# Patient Record
Sex: Male | Born: 1970
Health system: Southern US, Community
[De-identification: ages and names within clinical notes are randomized; demographics above are authoritative.]

## PROBLEM LIST (undated history)

## (undated) DIAGNOSIS — E669 Obesity, unspecified: Secondary | ICD-10-CM

## (undated) DIAGNOSIS — E785 Hyperlipidemia, unspecified: Secondary | ICD-10-CM

## (undated) DIAGNOSIS — I10 Essential (primary) hypertension: Secondary | ICD-10-CM

## (undated) DIAGNOSIS — E7800 Pure hypercholesterolemia, unspecified: Secondary | ICD-10-CM

## (undated) DIAGNOSIS — E119 Type 2 diabetes mellitus without complications: Secondary | ICD-10-CM

## (undated) DIAGNOSIS — R0681 Apnea, not elsewhere classified: Secondary | ICD-10-CM

## (undated) HISTORY — DX: Essential (primary) hypertension: I10

## (undated) HISTORY — DX: Obesity, unspecified: E66.9

## (undated) HISTORY — DX: Apnea, not elsewhere classified: R06.81

## (undated) HISTORY — DX: Hyperlipidemia, unspecified: E78.5

## (undated) HISTORY — DX: Type 2 diabetes mellitus without complications: E11.9

---

## 2001-01-13 ENCOUNTER — Inpatient Hospital Stay (HOSPITAL_COMMUNITY): Admission: RE | Admit: 2001-01-13 | Discharge: 2001-01-14 | Payer: Self-pay | Admitting: Orthopaedic Surgery

## 2013-10-01 ENCOUNTER — Encounter: Payer: Self-pay | Admitting: Family Medicine

## 2013-10-01 ENCOUNTER — Ambulatory Visit (INDEPENDENT_AMBULATORY_CARE_PROVIDER_SITE_OTHER): Payer: BC Managed Care – PPO | Admitting: Family Medicine

## 2013-10-01 VITALS — BP 132/94 | HR 84 | Temp 97.4°F | Resp 18 | Ht 74.0 in | Wt 339.0 lb

## 2013-10-01 DIAGNOSIS — E1165 Type 2 diabetes mellitus with hyperglycemia: Principal | ICD-10-CM

## 2013-10-01 DIAGNOSIS — R0681 Apnea, not elsewhere classified: Secondary | ICD-10-CM | POA: Insufficient documentation

## 2013-10-01 DIAGNOSIS — E785 Hyperlipidemia, unspecified: Secondary | ICD-10-CM

## 2013-10-01 DIAGNOSIS — IMO0001 Reserved for inherently not codable concepts without codable children: Secondary | ICD-10-CM

## 2013-10-01 DIAGNOSIS — E669 Obesity, unspecified: Secondary | ICD-10-CM | POA: Insufficient documentation

## 2013-10-01 DIAGNOSIS — I10 Essential (primary) hypertension: Secondary | ICD-10-CM

## 2013-10-01 LAB — LIPID PANEL
Cholesterol: 220 mg/dL — ABNORMAL HIGH (ref 0–200)
HDL: 36 mg/dL — ABNORMAL LOW (ref >39)
LDL Cholesterol: 152 mg/dL — ABNORMAL HIGH (ref 0–99)
Total CHOL/HDL Ratio: 6.1 Ratio
Triglycerides: 158 mg/dL — ABNORMAL HIGH (ref <150)
VLDL: 32 mg/dL (ref 0–40)

## 2013-10-01 LAB — COMPLETE METABOLIC PANEL WITH GFR
ALT: 29 U/L (ref 0–53)
AST: 21 U/L (ref 0–37)
Albumin: 4.4 g/dL (ref 3.5–5.2)
Alkaline Phosphatase: 65 U/L (ref 39–117)
BUN: 15 mg/dL (ref 6–23)
CO2: 26 mEq/L (ref 19–32)
Calcium: 9.1 mg/dL (ref 8.4–10.5)
Chloride: 101 mEq/L (ref 96–112)
Creat: 0.71 mg/dL (ref 0.50–1.35)
GFR, Est African American: >89 mL/min
GFR, Est Non African American: >89 mL/min
Glucose, Bld: 162 mg/dL — ABNORMAL HIGH (ref 70–99)
Potassium: 4.2 mEq/L (ref 3.5–5.3)
Sodium: 137 mEq/L (ref 135–145)
Total Bilirubin: 0.6 mg/dL (ref 0.2–1.2)
Total Protein: 7 g/dL (ref 6.0–8.3)

## 2013-10-01 LAB — HEMOGLOBIN A1C
Hgb A1c MFr Bld: 7.7 % — ABNORMAL HIGH (ref <5.7)
Mean Plasma Glucose: 174 mg/dL — ABNORMAL HIGH (ref <117)

## 2013-10-01 LAB — MICROALBUMIN, URINE: Microalb, Ur: 1.13 mg/dL (ref 0.00–1.89)

## 2013-10-01 MED ORDER — DAPAGLIFLOZIN PROPANEDIOL 10 MG PO TABS: 10.0000 mg | ORAL_TABLET | Freq: Every day | ORAL | Status: DC

## 2013-10-01 MED ORDER — METFORMIN HCL 1000 MG PO TABS: 1000.0000 mg | ORAL_TABLET | Freq: Every day | ORAL | Status: DC

## 2013-10-01 MED ORDER — LOSARTAN POTASSIUM 25 MG PO TABS: 25.0000 mg | ORAL_TABLET | Freq: Every day | ORAL | Status: DC

## 2013-10-01 NOTE — Progress Notes (Signed)
Subjective:    Patient ID: Kyle Wyatt, male    DOB: 1971/01/10, 43 y.o.   MRN: [id number]  HPI Patient is a very pleasant 43 year old white male who presents today for followup. He has not been seen in more than one year. Is history of diabetes mellitus type 2. He was taking metformin 1000 mg by mouth twice a day and invokana 150 mg poqday.  He has been off his medications for several weeks due to lack of insurance and cost. He is not checking his sugars. His blood pressures also slightly elevated today although he has not been taking his losartan. He also has a history of hyperlipidemia but he has not been taking the Pravachol 40 mg by mouth daily due to lack of insurance. He denies any myalgias or right upper quadrant pain on the medication. He denies any chest pain shortness of breath or dyspnea on exertion. He is not engaging in any regular exercise. He is not taking an aspirin. He has never seen an eye doctor. Past Medical History  Diagnosis Date  . Hyperlipidemia   . Hypertension   . Diabetes mellitus without complication   . Obesity   . Apnea    No current outpatient prescriptions on file prior to visit.   No current facility-administered medications on file prior to visit.   Not on File History   Social History  . Marital Status: Married    Spouse Name: N/A    Number of Children: N/A  . Years of Education: N/A   Occupational History  . Not on file.   Social History Main Topics  . Smoking status: Never Smoker   . Smokeless tobacco: Not on file  . Alcohol Use: No  . Drug Use: No  . Sexual Activity: Not on file   Other Topics Concern  . Not on file   Social History Narrative  . No narrative on file   Family History  Problem Relation Age of Onset  . Heart disease Father       Review of Systems  All other systems reviewed and are negative.       Objective:   Physical Exam  Vitals reviewed. Constitutional: He appears well-developed and  well-nourished.  Neck: Neck supple. No JVD present.  Cardiovascular: Normal rate, regular rhythm and normal heart sounds.  Exam reveals no gallop.   No murmur heard. Pulmonary/Chest: Effort normal and breath sounds normal. No respiratory distress. He has no wheezes. He has no rales. He exhibits no tenderness.  Abdominal: Soft. Bowel sounds are normal. He exhibits no distension. There is no tenderness. There is no rebound and no guarding.  Musculoskeletal: He exhibits no edema.  Lymphadenopathy:    He has no cervical adenopathy.   Diabetic foot exam was performed. He has normal pulses in both feet. He has normal sensation 10 mg thumb. There is no ulcers or deformities in the feet.       Assessment & Plan:  1. Type II or unspecified type diabetes mellitus without mention of complication, uncontrolled Recommend patient begin taking aspirin 81 mg by mouth daily. Recommended an annual exam. Check a CBC CMP fasting lipid panel and urine microalbumin. Restart patient on metformin 1000 mg by mouth twice a day. I will also start the patient on farxiga 10 mg poqday.  Recheck hemoglobin A1c in 3 months. - COMPLETE METABOLIC PANEL WITH GFR - Lipid panel - Hemoglobin A1c - Microalbumin, urine  2. HTN (hypertension) Resume losartan 25 mg poqday.  3. HLD (hyperlipidemia) Check fasting lipid panel. LDL is less than 100

## 2013-10-07 ENCOUNTER — Telehealth: Payer: Self-pay | Admitting: *Deleted

## 2013-10-07 MED ORDER — PRAVASTATIN SODIUM 40 MG PO TABS: 40.0000 mg | ORAL_TABLET | Freq: Every day | ORAL | Status: DC

## 2013-10-07 NOTE — Telephone Encounter (Signed)
Per orders on labs, medication sent to pharmacy.   Call placed to patient and patient wife made aware.

## 2013-10-27 ENCOUNTER — Encounter: Payer: Self-pay | Admitting: Family Medicine

## 2013-10-28 ENCOUNTER — Other Ambulatory Visit: Payer: Self-pay | Admitting: Family Medicine

## 2013-11-01 MED ORDER — GLUCOSE BLOOD VI STRP: ORAL_STRIP | Status: DC

## 2013-11-01 MED ORDER — CANAGLIFLOZIN 300 MG PO TABS: 1.0000 | ORAL_TABLET | Freq: Every day | ORAL | Status: DC

## 2014-02-22 ENCOUNTER — Other Ambulatory Visit: Payer: Self-pay

## 2014-02-22 ENCOUNTER — Telehealth: Payer: Self-pay | Admitting: *Deleted

## 2014-02-22 ENCOUNTER — Encounter: Payer: Self-pay | Admitting: Family Medicine

## 2014-02-22 ENCOUNTER — Emergency Department (HOSPITAL_COMMUNITY): Payer: BC Managed Care – PPO

## 2014-02-22 ENCOUNTER — Emergency Department (HOSPITAL_COMMUNITY)
Admission: EM | Admit: 2014-02-22 | Discharge: 2014-02-22 | Disposition: A | Discharge status: Home / Self Care | Payer: BC Managed Care – PPO | Attending: Emergency Medicine | Admitting: Emergency Medicine

## 2014-02-22 ENCOUNTER — Ambulatory Visit (INDEPENDENT_AMBULATORY_CARE_PROVIDER_SITE_OTHER): Payer: BC Managed Care – PPO | Admitting: Family Medicine

## 2014-02-22 ENCOUNTER — Encounter (HOSPITAL_COMMUNITY): Payer: Self-pay | Admitting: Emergency Medicine

## 2014-02-22 VITALS — BP 132/86 | HR 68 | Temp 98.3°F | Resp 14 | Ht 75.0 in | Wt 329.0 lb

## 2014-02-22 DIAGNOSIS — E119 Type 2 diabetes mellitus without complications: Secondary | ICD-10-CM | POA: Insufficient documentation

## 2014-02-22 DIAGNOSIS — Z79899 Other long term (current) drug therapy: Secondary | ICD-10-CM | POA: Insufficient documentation

## 2014-02-22 DIAGNOSIS — R079 Chest pain, unspecified: Secondary | ICD-10-CM | POA: Insufficient documentation

## 2014-02-22 DIAGNOSIS — E669 Obesity, unspecified: Secondary | ICD-10-CM | POA: Insufficient documentation

## 2014-02-22 DIAGNOSIS — E785 Hyperlipidemia, unspecified: Secondary | ICD-10-CM

## 2014-02-22 DIAGNOSIS — J209 Acute bronchitis, unspecified: Secondary | ICD-10-CM

## 2014-02-22 DIAGNOSIS — Z7982 Long term (current) use of aspirin: Secondary | ICD-10-CM | POA: Insufficient documentation

## 2014-02-22 DIAGNOSIS — I10 Essential (primary) hypertension: Secondary | ICD-10-CM

## 2014-02-22 DIAGNOSIS — R51 Headache: Secondary | ICD-10-CM | POA: Insufficient documentation

## 2014-02-22 HISTORY — DX: Pure hypercholesterolemia, unspecified: E78.00

## 2014-02-22 LAB — BASIC METABOLIC PANEL
Anion gap: 16 — ABNORMAL HIGH (ref 5–15)
BUN: 13 mg/dL (ref 6–23)
CALCIUM: 9.3 mg/dL (ref 8.4–10.5)
CO2: 23 mEq/L (ref 19–32)
CREATININE: 0.65 mg/dL (ref 0.50–1.35)
Chloride: 101 mEq/L (ref 96–112)
GFR calc non Af Amer: >90 mL/min (ref >90)
Glucose, Bld: 134 mg/dL — ABNORMAL HIGH (ref 70–99)
Potassium: 4.3 mEq/L (ref 3.7–5.3)
Sodium: 140 mEq/L (ref 137–147)

## 2014-02-22 LAB — CBC
HCT: 47.2 % (ref 39.0–52.0)
Hemoglobin: 15.7 g/dL (ref 13.0–17.0)
MCH: 28.4 pg (ref 26.0–34.0)
MCHC: 33.3 g/dL (ref 30.0–36.0)
MCV: 85.5 fL (ref 78.0–100.0)
Platelets: 256 10*3/uL (ref 150–400)
RBC: 5.52 MIL/uL (ref 4.22–5.81)
RDW: 12.9 % (ref 11.5–15.5)
WBC: 7.8 10*3/uL (ref 4.0–10.5)

## 2014-02-22 LAB — I-STAT TROPONIN, ED: Troponin i, poc: 0 ng/mL (ref 0.00–0.08)

## 2014-02-22 LAB — PRO B NATRIURETIC PEPTIDE: Pro B Natriuretic peptide (BNP): 33.6 pg/mL (ref 0–125)

## 2014-02-22 MED ORDER — ALBUTEROL SULFATE HFA 108 (90 BASE) MCG/ACT IN AERS
2.0000 | INHALATION_SPRAY | RESPIRATORY_TRACT | Status: DC
  Administered 2014-02-22: 2 via RESPIRATORY_TRACT
  Filled 2014-02-22: qty 6.7

## 2014-02-22 MED ORDER — IPRATROPIUM-ALBUTEROL 0.5-2.5 (3) MG/3ML IN SOLN
3.0000 mL | Freq: Once | RESPIRATORY_TRACT | Status: AC
  Administered 2014-02-22: 3 mL via RESPIRATORY_TRACT
  Filled 2014-02-22: qty 3

## 2014-02-22 MED ORDER — ALBUTEROL SULFATE (2.5 MG/3ML) 0.083% IN NEBU
2.5000 mg | INHALATION_SOLUTION | Freq: Once | RESPIRATORY_TRACT | Status: AC
  Administered 2014-02-22: 2.5 mg via RESPIRATORY_TRACT
  Filled 2014-02-22: qty 3

## 2014-02-22 NOTE — ED Provider Notes (Signed)
CSN: [id number]     Arrival date & time 02/22/14  1318 History   First MD Initiated Contact with Patient 02/22/14 1635     Chief Complaint  Patient presents with  . Chest Pain     (Consider location/radiation/quality/duration/timing/severity/associated sxs/prior Treatment) The history is provided by the patient and the spouse.   Kyle Wyatt is a 43 y.o. male presenting with a 3 day history of persistent subtle chest pressure which becomes more noticeable with exertion along with a nonproductive cough which is also triggered by exertion.  He denies chest pain.  He reports having cold like symptoms last week including some nasal and sinus congestion with sore throat which has improved.  He has a history of pneumonia and was concerned about this possibility so was seen by his pcp today and sent here for further evaluation. He was told he had an abnormal ekg in their office today but does have a copy of this ekg with him.  He denies fevers, chills, nausea, vomiting. He is not a smoker and has no recent periods of sedation/long car/plane trips,denies lower extremity trauma or swelling.  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     Past Medical History  Diagnosis Date  . Obesity   . Diabetes mellitus without complication   . Hypertension   . High cholesterol    History reviewed. No pertinent past surgical history. History reviewed. No pertinent family history. History  Substance Use Topics  . Smoking status: Not on file  . Smokeless tobacco: Not on file  . Alcohol Use: No    Review of Systems  Constitutional: Negative for fever and chills.  HENT: Negative for congestion and sore throat.   Eyes: Negative.   Respiratory: Positive for chest tightness and shortness of breath. Negative for wheezing and stridor.   Cardiovascular: Negative for chest pain and palpitations.  Gastrointestinal: Negative for nausea and  abdominal pain.  Genitourinary: Negative.   Musculoskeletal: Negative for arthralgias, joint swelling and neck pain.  Skin: Negative.  Negative for rash and wound.  Neurological: Positive for headaches. Negative for dizziness, weakness, light-headedness and numbness.  Psychiatric/Behavioral: Negative.       Allergies  Review of patient's allergies indicates no known allergies.  Home Medications   Prior to Admission medications   Medication Sig Start Date End Date Taking? Authorizing Provider  acetaminophen (TYLENOL) 500 MG tablet Take 1,000 mg by mouth every 6 (six) hours as needed for headache.   Yes Historical Provider, MD  aspirin 81 MG tablet Take 81 mg by mouth every morning.   Yes Historical Provider, MD  Canagliflozin (INVOKANA) 300 MG TABS Take 300 mg by mouth every morning.   Yes Historical Provider, MD  DiphenhydrAMINE HCl (ALLERGY MEDICATION PO) Take 1 tablet by mouth daily as needed (for congestion).   Yes Historical Provider, MD  guaiFENesin (MUCINEX) 600 MG 12 hr tablet Take 600 mg by mouth 2 (two) times daily as needed for cough or to loosen phlegm.   Yes Historical Provider, MD  losartan (COZAAR) 25 MG tablet Take 25 mg by mouth every morning.    Yes Historical Provider, MD  metFORMIN (GLUMETZA) 1000 MG (MOD) 24 hr tablet Take 1,000 mg by mouth daily with breakfast.   Yes Historical Provider, MD   BP 109/52  Pulse 106  Temp(Src) 98.1 F (36.7 C) (Oral)  Resp 23  SpO2 98% Physical Exam  Nursing note  and vitals reviewed. Constitutional: He appears well-developed and well-nourished.  HENT:  Head: Normocephalic and atraumatic.  Mouth/Throat: No oropharyngeal exudate.  Eyes: Conjunctivae are normal.  Neck: Normal range of motion.  Cardiovascular: Normal rate, regular rhythm, normal heart sounds and intact distal pulses.   Pulmonary/Chest: Effort normal and breath sounds normal. He has no wheezes. He has no rhonchi. He has no rales. He exhibits no tenderness.   Decreased breath sounds throughout.  No wheezing.  Abdominal: Soft. Bowel sounds are normal. There is no tenderness.  Musculoskeletal: Normal range of motion.  Neurological: He is alert.  Skin: Skin is warm and dry.  Psychiatric: He has a normal mood and affect.    ED Course  Procedures (including critical care time) Labs Review Labs Reviewed  BASIC METABOLIC PANEL - Abnormal; Notable for the following:    Glucose, Bld 134 (*)    Anion gap 16 (*)    All other components within normal limits  CBC  PRO B NATRIURETIC PEPTIDE  I-STAT TROPOININ, ED    Imaging Review Dg Chest 2 View  02/22/2014   CLINICAL DATA:  CHEST PAINChest pain, shortness of breath  EXAM: CHEST  2 VIEW  COMPARISON:  None.  FINDINGS: The cardiac and mediastinal silhouettes are stable in size and contour, and remain within normal limits.  The lungs are normally inflated. No airspace consolidation, pleural effusion, or pulmonary edema is identified. There is no pneumothorax.  No acute osseous abnormality identified.  IMPRESSION: No active cardiopulmonary disease.   Electronically Signed   By: Rise Mu M.D.   On: 02/22/2014 15:05     EKG Interpretation   Date/Time:  Tuesday February 22 2014 13:24:28 EDT Ventricular Rate:  85 PR Interval:  202 QRS Duration: 94 QT Interval:  380 QTC Calculation: 452 R Axis:   12 Text Interpretation:  Normal sinus rhythm Cannot rule out Anterior infarct  , age undetermined Abnormal ECG No previous tracing Confirmed by POLLINA   MD, CHRISTOPHER [phone]) on 02/22/2014 6:45:47 PM      MDM   Final diagnoses:  Bronchitis, acute, with bronchospasm    Pt was given albuterol and atrovent neb tx with resolution of chest tightness.  Pt feels much improved.  Re-exam with improved aeration, new expiratory wheeze at bases. He was ambulated in dept with no increased sob, no desaturation.   Discussed home tx including albuterol mdi, +/- steroids. Pt defers steroids given glucose  control issues.  I do not feel he needs steroids given significant response to neb alone.  He was given an mdi with spacer.  Advised f/u with pcp prn.  Consider wearing mask when mowing to avoid inhaling dust.    Burgess Amor, PA-C 02/23/14 1119

## 2014-02-22 NOTE — ED Notes (Signed)
Pt reports having chest tightness x 2 days with diaphoresis and difficulty catching his breath. Also reports recent cold symptoms and sore throat. Went to pcp office and sent here for further eval. ekg done at triage and airway intact.

## 2014-02-22 NOTE — Patient Instructions (Signed)
[facility]

## 2014-02-22 NOTE — ED Notes (Signed)
Patient is currently taking a breathing treatment.

## 2014-02-22 NOTE — Progress Notes (Signed)
Patient ID: Kyle FannyRichard Kyle Wyatt, male   DOB: [birth year], 43 y.o.   MRN: [id number]   Subjective:    Patient ID: Kyle Wyatt, male    DOB: [birth year], 43 y.o.   MRN: [id number]  Patient presents for Chest Discomfort/ Dizziness   patient here with chest pain and tightness for the past 4 days. He thought he may have had walking pneumonia but he's not had any cough. The pain is on and off throughout the day sometimes worse with exertion but nonradiating. He does have some diaphoresis and feels like he can't catch his breath. He's not had any fever vomiting or GI symptoms. He is history of uncontrolled diabetes mellitus as well as hypertension hyperlipidemia.    Review Of Systems:  GEN- denies fatigue, fever, weight loss,weakness, recent illness HEENT- denies eye drainage, change in vision, nasal discharge, CVS- + chest pain, palpitations RESP- + SOB, cough, wheeze ABD- denies N/V, change in stools, abd pain GU- denies dysuria, hematuria, dribbling, incontinence MSK- denies joint pain, muscle aches, injury Neuro- denies headache, +dizziness, syncope, seizure activity       Objective:    BP 132/86  Pulse 68  Temp(Src) 98.3 F (36.8 C) (Oral)  Resp 14  Ht 6\' 3"  (1.905 m)  Wt 329 lb (149.233 kg)  BMI 41.12 kg/m2  SpO2 97% GEN- NAD, alert and oriented x3, obese HEENT- PERRL, EOMI, non injected sclera, pink conjunctiva, MMM, oropharynx clear Neck- Supple, no LAD CVS- RRR, no murmur RESP-CTAB EXT- No edema Pulses- Radial 2+  EKG- NSR, decreased QRS contour, no ST elevation, 1st degree AV block, no comparison        Assessment & Plan:      Problem List Items Addressed This Visit   None      Note: This dictation was prepared with Dragon dictation along with smaller phrase technology. Any transcriptional errors that result from this process are unintentional.

## 2014-02-22 NOTE — Assessment & Plan Note (Signed)
Blood pressure well controlled

## 2014-02-22 NOTE — ED Notes (Signed)
Pt ambulated in hallway with standby assist. 97% on RA. Pt denies SOB, dizziness or other complaint.

## 2014-02-22 NOTE — Discharge Instructions (Signed)
Acute Bronchitis Bronchitis is inflammation of the airways that extend from the windpipe into the lungs (bronchi). The inflammation often causes mucus to develop. This leads to a cough, which is the most common symptom of bronchitis.  In acute bronchitis, the condition usually develops suddenly and goes away over time, usually in a couple weeks. Smoking, allergies, and asthma can make bronchitis worse. Repeated episodes of bronchitis may cause further lung problems.  CAUSES Acute bronchitis is most often caused by the same virus that causes a cold. The virus can spread from person to person (contagious) through coughing, sneezing, and touching contaminated objects. SIGNS AND SYMPTOMS   Cough.   Fever.   Coughing up mucus.   Body aches.   Chest congestion.   Chills.   Shortness of breath.   Sore throat.  DIAGNOSIS  Acute bronchitis is usually diagnosed through a physical exam. Your health care provider will also ask you questions about your medical history. Tests, such as chest X-rays, are sometimes done to rule out other conditions.  TREATMENT  Acute bronchitis usually goes away in a couple weeks. Oftentimes, no medical treatment is necessary. Medicines are sometimes given for relief of fever or cough. Antibiotic medicines are usually not needed but may be prescribed in certain situations. In some cases, an inhaler may be recommended to help reduce shortness of breath and control the cough. A cool mist vaporizer may also be used to help thin bronchial secretions and make it easier to clear the chest.  HOME CARE INSTRUCTIONS  Get plenty of rest.   Drink enough fluids to keep your urine clear or pale yellow (unless you have a medical condition that requires fluid restriction). Increasing fluids may help thin your respiratory secretions (sputum) and reduce chest congestion, and it will prevent dehydration.   Take medicines only as directed by your health care provider.  If  you were prescribed an antibiotic medicine, finish it all even if you start to feel better.  Avoid smoking and secondhand smoke. Exposure to cigarette smoke or irritating chemicals will make bronchitis worse. If you are a smoker, consider using nicotine gum or skin patches to help control withdrawal symptoms. Quitting smoking will help your lungs heal faster.   Reduce the chances of another bout of acute bronchitis by washing your hands frequently, avoiding people with cold symptoms, and trying not to touch your hands to your mouth, nose, or eyes.   Keep all follow-up visits as directed by your health care provider.  SEEK MEDICAL CARE IF: Your symptoms do not improve after 1 week of treatment.  SEEK IMMEDIATE MEDICAL CARE IF:  You develop an increased fever or chills.   You have chest pain.   You have severe shortness of breath.  You have bloody sputum.   You develop dehydration.  You faint or repeatedly feel like you are going to pass out.  You develop repeated vomiting.  You develop a severe headache. MAKE SURE YOU:   Understand these instructions.  Will watch your condition.  Will get help right away if you are not doing well or get worse. Document Released: 08/22/2004 Document Revised: 11/29/2013 Document Reviewed: 01/05/2013 [facility] Patient Information 2015 [place], [place]. This information is not intended to replace advice given to you by your health care provider. Make sure you discuss any questions you have with your health care provider.   Use your inhaler if you are wheezing or short of breath - 2 puffs every 4 hours.  Get rechecked if you  develop any worsened symptoms.  Consider wearing protection (mask) when out mowing.

## 2014-02-22 NOTE — Assessment & Plan Note (Signed)
Last A1c 7.7% continue current medications.

## 2014-02-22 NOTE — ED Notes (Signed)
ED PA at bedside

## 2014-02-22 NOTE — ED Notes (Signed)
Pt states that when he has the balling up pain in his chest, he became sweaty, clammy, and short of breath

## 2014-02-22 NOTE — Assessment & Plan Note (Signed)
With his comorbidities of hypertension diabetes in no true sign of any lung pathology or infection today in the office I will send him to the ER for chest x-ray as well as a set of troponins. He voiced understanding and will go directly his wife is with him today.

## 2014-02-22 NOTE — Telephone Encounter (Signed)
Noted that patient has not gone to ER as directed by MD.   Call placed to patient to inquire. No answer, no VM noted.

## 2014-02-22 NOTE — ED Notes (Signed)
Family at bedside. 

## 2014-02-23 NOTE — ED Provider Notes (Signed)
Medical screening examination/treatment/procedure(s) were performed by non-physician practitioner and as supervising physician I was immediately available for consultation/collaboration.   EKG Interpretation   Date/Time:  Tuesday February 22 2014 13:24:28 EDT Ventricular Rate:  85 PR Interval:  202 QRS Duration: 94 QT Interval:  380 QTC Calculation: 452 R Axis:   12 Text Interpretation:  Normal sinus rhythm Cannot rule out Anterior infarct  , age undetermined Abnormal ECG No previous tracing Confirmed by Blinda LeatherwoodPOLLINA   MD, Margarita Croke [phone]([id number]) on 02/22/2014 6:45:47 PM        Kyle Creasehristopher J. Vollie Aaron, MD 02/23/14 1750

## 2014-03-05 ENCOUNTER — Other Ambulatory Visit: Payer: Self-pay | Admitting: Family Medicine

## 2014-03-07 ENCOUNTER — Other Ambulatory Visit: Payer: BC Managed Care – PPO

## 2014-03-07 DIAGNOSIS — I10 Essential (primary) hypertension: Secondary | ICD-10-CM

## 2014-03-07 DIAGNOSIS — E119 Type 2 diabetes mellitus without complications: Secondary | ICD-10-CM

## 2014-03-07 DIAGNOSIS — E785 Hyperlipidemia, unspecified: Secondary | ICD-10-CM

## 2014-03-07 LAB — LIPID PANEL
CHOLESTEROL: 221 mg/dL — AB (ref 0–200)
HDL: 36 mg/dL — ABNORMAL LOW (ref >39)
LDL Cholesterol: 152 mg/dL — ABNORMAL HIGH (ref 0–99)
TRIGLYCERIDES: 167 mg/dL — AB (ref <150)
Total CHOL/HDL Ratio: 6.1 Ratio
VLDL: 33 mg/dL (ref 0–40)

## 2014-03-07 LAB — COMPLETE METABOLIC PANEL WITH GFR
ALBUMIN: 4.5 g/dL (ref 3.5–5.2)
ALT: 27 U/L (ref 0–53)
AST: 20 U/L (ref 0–37)
Alkaline Phosphatase: 61 U/L (ref 39–117)
BILIRUBIN TOTAL: 0.5 mg/dL (ref 0.2–1.2)
BUN: 16 mg/dL (ref 6–23)
CO2: 25 meq/L (ref 19–32)
Calcium: 9.5 mg/dL (ref 8.4–10.5)
Chloride: 101 mEq/L (ref 96–112)
Creat: 0.65 mg/dL (ref 0.50–1.35)
GLUCOSE: 128 mg/dL — AB (ref 70–99)
Potassium: 4.4 mEq/L (ref 3.5–5.3)
SODIUM: 135 meq/L (ref 135–145)
TOTAL PROTEIN: 7.3 g/dL (ref 6.0–8.3)

## 2014-03-07 LAB — CBC WITH DIFFERENTIAL/PLATELET
BASOS ABS: 0 10*3/uL (ref 0.0–0.1)
Basophils Relative: 0 % (ref 0–1)
Eosinophils Absolute: 0.2 10*3/uL (ref 0.0–0.7)
Eosinophils Relative: 2 % (ref 0–5)
HCT: 45.6 % (ref 39.0–52.0)
HEMOGLOBIN: 16 g/dL (ref 13.0–17.0)
Lymphocytes Relative: 26 % (ref 12–46)
Lymphs Abs: 2.1 10*3/uL (ref 0.7–4.0)
MCH: 28.5 pg (ref 26.0–34.0)
MCHC: 35.1 g/dL (ref 30.0–36.0)
MCV: 81.1 fL (ref 78.0–100.0)
Monocytes Absolute: 0.6 10*3/uL (ref 0.1–1.0)
Monocytes Relative: 8 % (ref 3–12)
NEUTROS ABS: 5.2 10*3/uL (ref 1.7–7.7)
NEUTROS PCT: 64 % (ref 43–77)
Platelets: 267 10*3/uL (ref 150–400)
RBC: 5.62 MIL/uL (ref 4.22–5.81)
RDW: 13.5 % (ref 11.5–15.5)
WBC: 8.1 10*3/uL (ref 4.0–10.5)

## 2014-03-07 LAB — HEMOGLOBIN A1C
Hgb A1c MFr Bld: 7.2 % — ABNORMAL HIGH (ref <5.7)
Mean Plasma Glucose: 160 mg/dL — ABNORMAL HIGH (ref <117)

## 2014-03-07 NOTE — Telephone Encounter (Signed)
Refill appropriate and filled per protocol. 

## 2014-03-22 ENCOUNTER — Ambulatory Visit: Payer: BC Managed Care – PPO | Admitting: Family Medicine

## 2014-04-12 ENCOUNTER — Encounter (HOSPITAL_COMMUNITY): Payer: Self-pay | Admitting: Emergency Medicine

## 2014-05-06 ENCOUNTER — Telehealth: Payer: Self-pay | Admitting: Family Medicine

## 2014-05-06 NOTE — Telephone Encounter (Signed)
Per [facility] ok to not take morning dose but can resume evening dose as usual. Pt's wife aware.

## 2014-05-06 NOTE — Telephone Encounter (Signed)
Patients wife is calling to ask the question if he is getting ready to have an endoscopy done, does he still need to take his diabetic medication the morning of the surgery  [phone] wife is darlene

## 2014-06-07 ENCOUNTER — Encounter: Payer: Self-pay | Admitting: Family Medicine

## 2014-06-07 ENCOUNTER — Ambulatory Visit (INDEPENDENT_AMBULATORY_CARE_PROVIDER_SITE_OTHER): Payer: BC Managed Care – PPO | Admitting: Family Medicine

## 2014-06-07 VITALS — BP 98/64 | HR 110 | Temp 99.0°F | Resp 20 | Ht 74.0 in | Wt 318.0 lb

## 2014-06-07 DIAGNOSIS — J329 Chronic sinusitis, unspecified: Secondary | ICD-10-CM

## 2014-06-07 MED ORDER — AZITHROMYCIN 250 MG PO TABS: ORAL_TABLET | ORAL | Status: DC

## 2014-06-07 MED ORDER — HYDROCODONE-HOMATROPINE 5-1.5 MG/5ML PO SYRP: 5.0000 mL | ORAL_SOLUTION | Freq: Three times a day (TID) | ORAL | Status: DC | PRN

## 2014-06-07 NOTE — Progress Notes (Signed)
Subjective:    Patient ID: Kyle Wyatt, male    DOB: May 14, 1971, 43 y.o.   MRN: [id number]  HPI Patient is a very pleasant 43 year old white male who presents with 2 days of chest congestion and cough, sinus pressure, postnasal drip, rhinorrhea, and head congestion. He reports low-grade fevers. His blood pressure slightly low today and his heart rate is elevated. He appears mildly dehydrated. He denies any nausea vomiting or diarrhea. He denies any chest pain or shortness of breath. He denies any sore throat. He does report a scratchy throat. Past Medical History  Diagnosis Date  . Hyperlipidemia   . Apnea   . Obesity   . Diabetes mellitus without complication   . Hypertension   . High cholesterol    No past surgical history on file. Current Outpatient Prescriptions on File Prior to Visit  Medication Sig Dispense Refill  . acetaminophen (TYLENOL) 500 MG tablet Take 1,000 mg by mouth every 6 (six) hours as needed for headache.    Marland Kitchen. aspirin 81 MG tablet Take 81 mg by mouth every morning.    . Canagliflozin (INVOKANA) 300 MG TABS Take 300 mg by mouth every morning.    . Dapagliflozin Propanediol (FARXIGA) 10 MG TABS Take 10 mg by mouth daily. 30 tablet 11  . DiphenhydrAMINE HCl (ALLERGY MEDICATION PO) Take 1 tablet by mouth daily as needed (for congestion).    Marland Kitchen. glucose blood test strip Use as instructed 100 each 12  . guaiFENesin (MUCINEX) 600 MG 12 hr tablet Take 600 mg by mouth 2 (two) times daily as needed for cough or to loosen phlegm.    . INVOKANA 300 MG TABS TAKE ONE TABLET BY MOUTH ONCE DAILY 30 tablet 6  . losartan (COZAAR) 25 MG tablet Take 1 tablet (25 mg total) by mouth daily. 30 tablet 11  . losartan (COZAAR) 25 MG tablet Take 25 mg by mouth every morning.     . metFORMIN (GLUCOPHAGE) 1000 MG tablet Take 1 tablet (1,000 mg total) by mouth daily with breakfast. 60 tablet 11  . metFORMIN (GLUMETZA) 1000 MG (MOD) 24 hr tablet Take 1,000 mg by mouth daily with breakfast.      . pravastatin (PRAVACHOL) 40 MG tablet Take 1 tablet (40 mg total) by mouth daily. 90 tablet 3   No current facility-administered medications on file prior to visit.   No Known Allergies History   Social History  . Marital Status: Married    Spouse Name: N/A    Number of Children: N/A  . Years of Education: N/A   Occupational History  . Not on file.   Social History Main Topics  . Smoking status: Never Smoker   . Smokeless tobacco: Not on file  . Alcohol Use: No  . Drug Use: No  . Sexual Activity: Not on file   Other Topics Concern  . Not on file   Social History Narrative   ** Merged History Encounter **          Review of Systems  All other systems reviewed and are negative.      Objective:   Physical Exam  Constitutional: He appears well-developed and well-nourished.  HENT:  Right Ear: External ear normal.  Left Ear: External ear normal.  Nose: Nose normal.  Mouth/Throat: Oropharynx is clear and moist. No oropharyngeal exudate.  Eyes: Conjunctivae are normal.  Neck: Neck supple.  Cardiovascular: Normal rate, regular rhythm and normal heart sounds.   No murmur heard. Pulmonary/Chest: Effort normal  and breath sounds normal. No respiratory distress. He has no wheezes. He has no rales.  Abdominal: Soft. Bowel sounds are normal.  Lymphadenopathy:    He has no cervical adenopathy.  Vitals reviewed.         Assessment & Plan:  Rhinosinusitis - Plan: azithromycin (ZITHROMAX) 250 MG tablet, HYDROcodone-homatropine (HYCODAN) 5-1.5 MG/5ML syrup, DISCONTINUED: azithromycin (ZITHROMAX) 250 MG tablet  Patient symptoms are consistent with a viral upper respiratory illness/rhinosinusitis. I have recommended tincture of time for 1 week. I recommended the patient discontinue his losartan temporarily due to his low blood pressure. I recommended that he push fluids. He can use Sudafed 60 mg every 6 hours as needed for head congestion. He can use Mucinex as needed for  chest congestion and cough. I gave the patient a prescription for Hycodan to take at night to help him rest and to stop the coughing. I did give the patient a prescription for a Z-Pak with explicit instructions not to fill unless his symptoms persist longer than 1 week, he develops a cough productive of purulent sputum, or he develops high fevers.

## 2014-07-28 ENCOUNTER — Encounter: Payer: Self-pay | Admitting: Family Medicine

## 2014-07-28 ENCOUNTER — Ambulatory Visit (INDEPENDENT_AMBULATORY_CARE_PROVIDER_SITE_OTHER): Payer: BC Managed Care – PPO | Admitting: Family Medicine

## 2014-07-28 VITALS — BP 110/70 | HR 76 | Temp 98.6°F | Resp 16 | Ht 74.0 in | Wt 309.0 lb

## 2014-07-28 DIAGNOSIS — I952 Hypotension due to drugs: Secondary | ICD-10-CM

## 2014-07-28 DIAGNOSIS — R000 Tachycardia, unspecified: Secondary | ICD-10-CM

## 2014-07-28 DIAGNOSIS — Z09 Encounter for follow-up examination after completed treatment for conditions other than malignant neoplasm: Secondary | ICD-10-CM

## 2014-07-28 LAB — CBC WITH DIFFERENTIAL/PLATELET
BASOS ABS: 0 10*3/uL (ref 0.0–0.1)
Basophils Relative: 0 % (ref 0–1)
Eosinophils Absolute: 0.4 10*3/uL (ref 0.0–0.7)
Eosinophils Relative: 4 % (ref 0–5)
HCT: 37.3 % — ABNORMAL LOW (ref 39.0–52.0)
Hemoglobin: 13.1 g/dL (ref 13.0–17.0)
LYMPHS ABS: 1.9 10*3/uL (ref 0.7–4.0)
Lymphocytes Relative: 21 % (ref 12–46)
MCH: 27.9 pg (ref 26.0–34.0)
MCHC: 35.1 g/dL (ref 30.0–36.0)
MCV: 79.4 fL (ref 78.0–100.0)
MPV: 8.2 fL — ABNORMAL LOW (ref 8.6–12.4)
Monocytes Absolute: 0.9 10*3/uL (ref 0.1–1.0)
Monocytes Relative: 10 % (ref 3–12)
NEUTROS ABS: 5.8 10*3/uL (ref 1.7–7.7)
Neutrophils Relative %: 65 % (ref 43–77)
Platelets: 459 10*3/uL — ABNORMAL HIGH (ref 150–400)
RBC: 4.7 MIL/uL (ref 4.22–5.81)
RDW: 12.8 % (ref 11.5–15.5)
WBC: 8.9 10*3/uL (ref 4.0–10.5)

## 2014-07-28 LAB — COMPLETE METABOLIC PANEL WITH GFR
ALT: 18 U/L (ref 0–53)
AST: 15 U/L (ref 0–37)
Albumin: 4 g/dL (ref 3.5–5.2)
Alkaline Phosphatase: 70 U/L (ref 39–117)
BUN: 12 mg/dL (ref 6–23)
CHLORIDE: 98 meq/L (ref 96–112)
CO2: 27 meq/L (ref 19–32)
Calcium: 9.4 mg/dL (ref 8.4–10.5)
Creat: 0.84 mg/dL (ref 0.50–1.35)
Glucose, Bld: 121 mg/dL — ABNORMAL HIGH (ref 70–99)
POTASSIUM: 4.7 meq/L (ref 3.5–5.3)
SODIUM: 136 meq/L (ref 135–145)
TOTAL PROTEIN: 7.1 g/dL (ref 6.0–8.3)
Total Bilirubin: 0.7 mg/dL (ref 0.2–1.2)

## 2014-07-28 LAB — HEMOGLOBIN A1C
Hgb A1c MFr Bld: 6.9 % — ABNORMAL HIGH (ref <5.7)
Mean Plasma Glucose: 151 mg/dL — ABNORMAL HIGH (ref <117)

## 2014-07-28 NOTE — Progress Notes (Signed)
Subjective:    Patient ID: Kyle Wyatt, male    DOB: [birth year], 43 y.o.   MRN: [id number]  HPI Patient is a very pleasant 43 year old white male who recently underwent gastric bypass on December 21. Since that time the patient has been unable to tolerate his metformin and his Invokana due to nausea. However his blood sugars are all less than 150. Patient states that typically 100-115.  I am concerned that the patient's blood pressure is getting low at 110/70. There is also some orthostatic dizziness and occasional palpitations. He is drinking approximately 2 ounces of liquid every 15-20 minutes at home. However he is basically on a clear liquid diet and he appears as though he may be slightly dehydrated today. He is still taking losartan 25 mg by mouth daily for renal protection. I have recommended that he discontinue this. His pain is well controlled. He denies any problems with his bowels. There is no evidence of cellulitis or infection at his surgical scars. There is some bruising on his abdomen but this appears old. There is no sign of active bleeding. He denies any melena or hematochezia. He denies any chest pain shortness of breath or lower extremity edema. Overall he is doing well. His flu shot and pneumonia vaccine are up-to-date.  He does have thrush on his tongue.  He is scheduled to follow the nutritionist next week. He is requesting a prescription for intranasal vitamin B12. Past Medical History  Diagnosis Date  . Hyperlipidemia   . Apnea   . Obesity   . Diabetes mellitus without complication   . Hypertension   . High cholesterol    No past surgical history on file. Current Outpatient Prescriptions on File Prior to Visit  Medication Sig Dispense Refill  . losartan (COZAAR) 25 MG tablet Take 1 tablet (25 mg total) by mouth daily. 30 tablet 11  . acetaminophen (TYLENOL) 500 MG tablet Take 1,000 mg by mouth every 6 (six) hours as needed for headache.    Marland Kitchen. aspirin 81 MG tablet Take  81 mg by mouth every morning.    . Canagliflozin (INVOKANA) 300 MG TABS Take 300 mg by mouth every morning.    . Dapagliflozin Propanediol (FARXIGA) 10 MG TABS Take 10 mg by mouth daily. (Patient not taking: Reported on 07/28/2014) 30 tablet 11  . DiphenhydrAMINE HCl (ALLERGY MEDICATION PO) Take 1 tablet by mouth daily as needed (for congestion).    Marland Kitchen. glucose blood test strip Use as instructed (Patient not taking: Reported on 07/28/2014) 100 each 12  . HYDROcodone-homatropine (HYCODAN) 5-1.5 MG/5ML syrup Take 5 mLs by mouth every 8 (eight) hours as needed for cough. (Patient not taking: Reported on 07/28/2014) 120 mL 0  . INVOKANA 300 MG TABS TAKE ONE TABLET BY MOUTH ONCE DAILY (Patient not taking: Reported on 07/28/2014) 30 tablet 6  . metFORMIN (GLUCOPHAGE) 1000 MG tablet Take 1 tablet (1,000 mg total) by mouth daily with breakfast. (Patient not taking: Reported on 07/28/2014) 60 tablet 11  . metFORMIN (GLUMETZA) 1000 MG (MOD) 24 hr tablet Take 1,000 mg by mouth daily with breakfast.    . pravastatin (PRAVACHOL) 40 MG tablet Take 1 tablet (40 mg total) by mouth daily. (Patient not taking: Reported on 07/28/2014) 90 tablet 3   No current facility-administered medications on file prior to visit.   No Known Allergies History   Social History  . Marital Status: Married    Spouse Name: N/A    Number of Children: N/A  .  Years of Education: N/A   Occupational History  . Not on file.   Social History Main Topics  . Smoking status: Never Smoker   . Smokeless tobacco: Not on file  . Alcohol Use: No  . Drug Use: No  . Sexual Activity: Not on file   Other Topics Concern  . Not on file   Social History Narrative   ** Merged History Encounter **          Review of Systems  All other systems reviewed and are negative.      Objective:   Physical Exam  Cardiovascular: Normal rate, regular rhythm and normal heart sounds.   Pulmonary/Chest: Effort normal and breath sounds normal. No  respiratory distress. He has no wheezes. He has no rales.  Abdominal: Soft. Bowel sounds are normal. He exhibits no distension. There is no tenderness. There is no rebound.  Musculoskeletal: He exhibits no edema.  Vitals reviewed.         Assessment & Plan:  Hospital discharge follow-up - Plan: COMPLETE METABOLIC PANEL WITH GFR, CBC with Differential, Hemoglobin A1c  Tachycardia  Hypotension due to drugs  I will treat the patient's thrush with nystatin swish and swallow 1 teaspoon every 6 hours for 7 days. I will give the patient intranasal vitamin B12 500 g once a week. I have recommended the patient discontinue losartan temporarily due to dehydration and low blood pressure and occasional tachycardia. I'm fine with him holding his diabetic medication until his blood sugars warrant medication. He will check his sugars several times a day and call me if they become elevated. I will check a CBC and a CMP today to rule out postoperative anemia and prerenal azotemia. Recheck his hemoglobin A1c in 3 months. Is no evidence of a postoperative complication at this time.

## 2014-08-03 ENCOUNTER — Encounter: Payer: Self-pay | Admitting: *Deleted

## 2014-08-18 ENCOUNTER — Other Ambulatory Visit: Payer: Self-pay | Admitting: Family Medicine

## 2014-08-18 MED ORDER — CYANOCOBALAMIN 500 MCG/0.1ML NA SOLN: 500.0000 ug | NASAL | Status: DC

## 2014-08-23 ENCOUNTER — Telehealth: Payer: Self-pay | Admitting: Family Medicine

## 2014-08-23 NOTE — Telephone Encounter (Signed)
PA submitted through [email or web address]  

## 2014-08-25 NOTE — Telephone Encounter (Signed)
Received PA determination.   PA approved 08/23/2014- 07/28/2038.  [id number]

## 2014-10-28 ENCOUNTER — Encounter: Payer: Self-pay | Admitting: Family Medicine

## 2014-10-28 ENCOUNTER — Ambulatory Visit (INDEPENDENT_AMBULATORY_CARE_PROVIDER_SITE_OTHER): Payer: BLUE CROSS/BLUE SHIELD | Admitting: Family Medicine

## 2014-10-28 VITALS — BP 110/70 | HR 68 | Temp 98.1°F | Resp 18 | Ht 74.0 in | Wt 274.0 lb

## 2014-10-28 DIAGNOSIS — E119 Type 2 diabetes mellitus without complications: Secondary | ICD-10-CM | POA: Diagnosis not present

## 2014-10-28 DIAGNOSIS — E785 Hyperlipidemia, unspecified: Secondary | ICD-10-CM

## 2014-10-28 DIAGNOSIS — I10 Essential (primary) hypertension: Secondary | ICD-10-CM | POA: Diagnosis not present

## 2014-10-28 LAB — COMPLETE METABOLIC PANEL WITH GFR
ALK PHOS: 72 U/L (ref 39–117)
ALT: 15 U/L (ref 0–53)
AST: 16 U/L (ref 0–37)
Albumin: 4.4 g/dL (ref 3.5–5.2)
BILIRUBIN TOTAL: 0.6 mg/dL (ref 0.2–1.2)
BUN: 17 mg/dL (ref 6–23)
CO2: 24 mEq/L (ref 19–32)
Calcium: 9.4 mg/dL (ref 8.4–10.5)
Chloride: 103 mEq/L (ref 96–112)
Creat: 0.67 mg/dL (ref 0.50–1.35)
GFR, Est African American: >89 mL/min
Glucose, Bld: 91 mg/dL (ref 70–99)
Potassium: 4.5 mEq/L (ref 3.5–5.3)
Sodium: 140 mEq/L (ref 135–145)
Total Protein: 7.4 g/dL (ref 6.0–8.3)

## 2014-10-28 LAB — LIPID PANEL
CHOLESTEROL: 165 mg/dL (ref 0–200)
HDL: 32 mg/dL — ABNORMAL LOW (ref >=40)
LDL Cholesterol: 111 mg/dL — ABNORMAL HIGH (ref 0–99)
Total CHOL/HDL Ratio: 5.2 Ratio
Triglycerides: 110 mg/dL (ref <150)
VLDL: 22 mg/dL (ref 0–40)

## 2014-10-28 LAB — HEMOGLOBIN A1C
HEMOGLOBIN A1C: 6.1 % — AB (ref <5.7)
Mean Plasma Glucose: 128 mg/dL — ABNORMAL HIGH (ref <117)

## 2014-10-28 NOTE — Progress Notes (Signed)
   Subjective:    Patient ID: Kyle Wyatt, male    DOB: 06-Jul-1971, 44 y.o.   MRN: [id number]  HPI  Patient is here for his regular check up.  He denies complaints.  Has lost over 60 pounds since his gastirc bypass.  FBS <90.  2 hr PPS <130.  He denies polyuria, polydipsia, blurred vision. Blood pressure is excellent. He denies chest pain shortness of breath or dyspnea on exertion. He has discontinued all of his chronic medications. He is due today for fasting lab work. He declines a diabetic eye exam. He declines a pneumonia vaccine Past Medical History  Diagnosis Date  . Hyperlipidemia   . Apnea   . Obesity   . Diabetes mellitus without complication   . Hypertension   . High cholesterol    No past surgical history on file. Current Outpatient Prescriptions on File Prior to Visit  Medication Sig Dispense Refill  . acetaminophen (TYLENOL) 500 MG tablet Take 1,000 mg by mouth every 6 (six) hours as needed for headache.    . Cyanocobalamin 500 MCG/0.1ML SOLN Place 0.1 mLs (500 mcg total) into the nose once a week. 1.3 mL 12  . glucose blood test strip Use as instructed 100 each 12   No current facility-administered medications on file prior to visit.   No Known Allergies History   Social History  . Marital Status: Married    Spouse Name: N/A  . Number of Children: N/A  . Years of Education: N/A   Occupational History  . Not on file.   Social History Main Topics  . Smoking status: Never Smoker   . Smokeless tobacco: Not on file  . Alcohol Use: No  . Drug Use: No  . Sexual Activity: Not on file   Other Topics Concern  . Not on file   Social History Narrative   ** Merged History Encounter **          Review of Systems  All other systems reviewed and are negative.      Objective:   Physical Exam  Constitutional: He appears well-developed and well-nourished.  Cardiovascular: Normal rate, regular rhythm and normal heart sounds.  Exam reveals no gallop and no  friction rub.   No murmur heard. Pulmonary/Chest: Effort normal and breath sounds normal. No respiratory distress. He has no wheezes. He has no rales. He exhibits no tenderness.  Abdominal: Soft. Bowel sounds are normal. He exhibits no distension and no mass. There is no tenderness. There is no rebound and no guarding.  Musculoskeletal: He exhibits no edema.  Vitals reviewed.         Assessment & Plan:  Essential hypertension - Plan: COMPLETE METABOLIC PANEL WITH GFR, Lipid panel, Hemoglobin A1c, Microalbumin, urine  HLD (hyperlipidemia) - Plan: COMPLETE METABOLIC PANEL WITH GFR, Lipid panel, Hemoglobin A1c, Microalbumin, urine  Diabetes mellitus type II, controlled - Plan: COMPLETE METABOLIC PANEL WITH GFR, Lipid panel, Hemoglobin A1c, Microalbumin, urine  Blood pressures well controlled. I'm very proud of this patient for losing some much weight. Sugars sound well controlled. I will check lab work. Hemoglobin A1c goal is less than 6.5. LDL goal is less than 100. If patient's labs are at goal, recheck in one year. Recheck in 6 months if not at goal. Patient continues to lose weight. Overall he is doing very well

## 2014-10-29 LAB — MICROALBUMIN, URINE: Microalb, Ur: 0.7 mg/dL (ref <2.0)

## 2015-01-10 ENCOUNTER — Encounter: Payer: Self-pay | Admitting: Family Medicine

## 2015-01-10 ENCOUNTER — Ambulatory Visit (INDEPENDENT_AMBULATORY_CARE_PROVIDER_SITE_OTHER): Payer: BLUE CROSS/BLUE SHIELD | Admitting: Family Medicine

## 2015-01-10 VITALS — BP 100/60 | HR 72 | Temp 97.7°F | Resp 16 | Ht 74.0 in | Wt 260.0 lb

## 2015-01-10 DIAGNOSIS — T148 Other injury of unspecified body region: Secondary | ICD-10-CM | POA: Diagnosis not present

## 2015-01-10 DIAGNOSIS — W57XXXA Bitten or stung by nonvenomous insect and other nonvenomous arthropods, initial encounter: Secondary | ICD-10-CM

## 2015-01-10 LAB — CBC WITH DIFFERENTIAL/PLATELET
Basophils Absolute: 0.1 10*3/uL (ref 0.0–0.1)
Basophils Relative: 1 % (ref 0–1)
EOS PCT: 5 % (ref 0–5)
Eosinophils Absolute: 0.4 10*3/uL (ref 0.0–0.7)
HEMATOCRIT: 42.7 % (ref 39.0–52.0)
HEMOGLOBIN: 14.5 g/dL (ref 13.0–17.0)
LYMPHS ABS: 2.5 10*3/uL (ref 0.7–4.0)
LYMPHS PCT: 35 % (ref 12–46)
MCH: 28.4 pg (ref 26.0–34.0)
MCHC: 34 g/dL (ref 30.0–36.0)
MCV: 83.6 fL (ref 78.0–100.0)
MONO ABS: 0.4 10*3/uL (ref 0.1–1.0)
MONOS PCT: 6 % (ref 3–12)
MPV: 9.9 fL (ref 8.6–12.4)
NEUTROS ABS: 3.8 10*3/uL (ref 1.7–7.7)
Neutrophils Relative %: 53 % (ref 43–77)
Platelets: 268 10*3/uL (ref 150–400)
RBC: 5.11 MIL/uL (ref 4.22–5.81)
RDW: 14.1 % (ref 11.5–15.5)
WBC: 7.2 10*3/uL (ref 4.0–10.5)

## 2015-01-10 LAB — COMPLETE METABOLIC PANEL WITH GFR
ALT: 13 U/L (ref 0–53)
AST: 14 U/L (ref 0–37)
Albumin: 4.4 g/dL (ref 3.5–5.2)
Alkaline Phosphatase: 73 U/L (ref 39–117)
BUN: 20 mg/dL (ref 6–23)
CO2: 26 mEq/L (ref 19–32)
Calcium: 9.4 mg/dL (ref 8.4–10.5)
Chloride: 104 mEq/L (ref 96–112)
Creat: 0.78 mg/dL (ref 0.50–1.35)
GFR, Est African American: >89 mL/min
GFR, Est Non African American: >89 mL/min
Glucose, Bld: 82 mg/dL (ref 70–99)
Potassium: 4.2 mEq/L (ref 3.5–5.3)
Sodium: 138 mEq/L (ref 135–145)
Total Bilirubin: 0.5 mg/dL (ref 0.2–1.2)
Total Protein: 7.1 g/dL (ref 6.0–8.3)

## 2015-01-10 MED ORDER — DOXYCYCLINE HYCLATE 100 MG PO TABS: 100.0000 mg | ORAL_TABLET | Freq: Two times a day (BID) | ORAL | Status: DC

## 2015-01-10 NOTE — Progress Notes (Signed)
   Subjective:    Patient ID: Kyle Wyatt, male    DOB: [birth year], 44 y.o.   MRN: [id number]  HPI Patient has had 9 different tick bites this summer. Beginning 3 weeks ago he started developing migratory arthralgias in his neck in his shoulders in his elbows and in his knees. They fluctuate in coming go. He also has a dull occipital headache that will not go away along with some mild neck stiffness. He also reports chronic fatigue. He denies any erythema migrans-like rash. He denies any fever. He denies any nausea or vomiting. He denies any weight loss. He denies any chest pain shortness of breath or dyspnea on exertion. Past Medical History  Diagnosis Date  . Hyperlipidemia   . Apnea   . Obesity   . Diabetes mellitus without complication   . Hypertension   . High cholesterol    No past surgical history on file. No current outpatient prescriptions on file prior to visit.   No current facility-administered medications on file prior to visit.   No Known Allergies History   Social History  . Marital Status: Married    Spouse Name: N/A  . Number of Children: N/A  . Years of Education: N/A   Occupational History  . Not on file.   Social History Main Topics  . Smoking status: Never Smoker   . Smokeless tobacco: Not on file  . Alcohol Use: No  . Drug Use: No  . Sexual Activity: Not on file   Other Topics Concern  . Not on file   Social History Narrative   ** Merged History Encounter **         Review of Systems  All other systems reviewed and are negative.      Objective:   Physical Exam  Constitutional: He appears well-developed and well-nourished. No distress.  Cardiovascular: Normal rate, regular rhythm and normal heart sounds.   No murmur heard. Pulmonary/Chest: Effort normal and breath sounds normal. No respiratory distress. He has no wheezes. He has no rales.  Abdominal: Soft. Bowel sounds are normal. He exhibits no distension. There is no tenderness.  There is no rebound and no guarding.  Musculoskeletal: He exhibits no edema.  Skin: No rash noted. He is not diaphoretic.  Vitals reviewed.         Assessment & Plan:  Tick bite - Plan: CBC with Differential/Platelet, COMPLETE METABOLIC PANEL WITH GFR, B. burgdorfi antibodies by WB, Rocky mtn spotted fvr abs pnl(IgG+IgM)  I am concerned the patient may have been exposed to Lyme disease. Begin doxycycline 100 mg by mouth twice a day for 21 days. Meanwhile I will check lab work including a CBC, CMP, arm SF titers, and Lyme titers. If lab work is negative and if he is not improving, then I will pursue a workup for autoimmune diseases

## 2015-01-12 LAB — ROCKY MTN SPOTTED FVR ABS PNL(IGG+IGM)
RMSF IgG: 0.09 IV
RMSF IgM: 0.23 IV

## 2015-01-13 LAB — B. BURGDORFI ANTIBODIES BY WB
B BURGDORFERI IGG ABS (IB): NEGATIVE
B BURGDORFERI IGM ABS (IB): NEGATIVE

## 2015-01-16 ENCOUNTER — Encounter: Payer: Self-pay | Admitting: Family Medicine

## 2015-11-08 ENCOUNTER — Ambulatory Visit (INDEPENDENT_AMBULATORY_CARE_PROVIDER_SITE_OTHER): Payer: BLUE CROSS/BLUE SHIELD | Admitting: Physician Assistant

## 2015-11-08 VITALS — BP 132/80 | HR 82 | Temp 97.9°F | Resp 18 | Wt 263.0 lb

## 2015-11-08 DIAGNOSIS — J019 Acute sinusitis, unspecified: Secondary | ICD-10-CM | POA: Diagnosis not present

## 2015-11-08 MED ORDER — PREDNISONE 20 MG PO TABS: ORAL_TABLET | ORAL | Status: DC

## 2015-11-08 NOTE — Progress Notes (Signed)
    Patient ID: Kyle Wyatt MRN: [id number], DOB: Oct 26, 1970, 45 y.o. Date of Encounter: 11/08/2015, 8:45 AM    Chief Complaint:  Chief Complaint  Patient presents with  . Sinusitis    sinus drainage, severe headache and gums and teeth are hurting x ffriday     HPI: 45 y.o. year old white male presents with above.   Says that he is having all congestion pressure and pain in his sinus areas. Says he has never had it to where his teeth and gums even hurt. However has been having all of these symptoms for 6 days now. States that he is having no mucus or drainage out of the nose and does not feel any drainage down the throat. Also says he is having no chest congestion. Says even before things got "stopped up" he was having no drainage out of the nose. No clear runny rhinorrhea or thick dark mucus. Has had no fevers or chills. No sore throat.     Home Meds:   Outpatient Prescriptions Prior to Visit  Medication Sig Dispense Refill  . doxycycline (VIBRA-TABS) 100 MG tablet Take 1 tablet (100 mg total) by mouth 2 (two) times daily. 42 tablet 0   No facility-administered medications prior to visit.    Allergies: No Known Allergies    Review of Systems: See HPI for pertinent ROS. All other ROS negative.    Physical Exam: Blood pressure 132/80, pulse 82, temperature 97.9 F (36.6 C), temperature source Oral, resp. rate 18, weight 263 lb (119.296 kg)., Body mass index is 33.75 kg/(m^2). General:  WNWD WM. Appears in no acute distress. HEENT: Normocephalic, atraumatic, eyes without discharge, sclera non-icteric, nares are without discharge. Bilateral auditory canals clear, TM's are without perforation, pearly grey and translucent with reflective cone of light bilaterally. Oral cavity moist, posterior pharynx without exudate, erythema, peritonsillar abscess. Tenderness with percussion to bilateral maxillary sinuses and some tenderness with percussion to frontal sinuses.  Neck: Supple. No  thyromegaly. No lymphadenopathy. Lungs: Clear bilaterally to auscultation without wheezes, rales, or rhonchi. Breathing is unlabored. Heart: Regular rhythm. No murmurs, rubs, or gallops. Msk:  Strength and tone normal for age. Extremities/Skin: Warm and dry.  Neuro: Alert and oriented X 3. Moves all extremities spontaneously. Gait is normal. CNII-XII grossly in tact. Psych:  Responds to questions appropriately with a normal affect.     ASSESSMENT AND PLAN:  45 y.o. year old male with  1. Acute sinusitis, recurrence not specified, unspecified location He is to take the prednisone taper as directed. I cautioned him regarding possible side effects that come with this medicine. He is to call if he starts developing thick dark mucus from the nose and we will call in an antibiotic if that occurs. Otherwise follow-up with symptoms do not resolve upon completion of this. - predniSONE (DELTASONE) 20 MG tablet; Take 3 daily for 2 days, then 2 daily for 2 days, then 1 daily for 2 days.  Dispense: 12 tablet; Refill: 0   Signed, [address] Academy[place], [place], [facility] 11/08/2015 8:45 AM

## 2017-03-09 ENCOUNTER — Encounter (HOSPITAL_COMMUNITY): Payer: Self-pay | Admitting: *Deleted

## 2017-03-09 ENCOUNTER — Emergency Department (HOSPITAL_COMMUNITY)
Admission: EM | Admit: 2017-03-09 | Discharge: 2017-03-10 | Disposition: A | Discharge status: Home / Self Care | Payer: BLUE CROSS/BLUE SHIELD | Attending: Emergency Medicine | Admitting: Emergency Medicine

## 2017-03-09 DIAGNOSIS — E119 Type 2 diabetes mellitus without complications: Secondary | ICD-10-CM | POA: Diagnosis not present

## 2017-03-09 DIAGNOSIS — I10 Essential (primary) hypertension: Secondary | ICD-10-CM | POA: Diagnosis not present

## 2017-03-09 DIAGNOSIS — Z9884 Bariatric surgery status: Secondary | ICD-10-CM | POA: Diagnosis not present

## 2017-03-09 DIAGNOSIS — R079 Chest pain, unspecified: Secondary | ICD-10-CM | POA: Insufficient documentation

## 2017-03-09 DIAGNOSIS — R1011 Right upper quadrant pain: Secondary | ICD-10-CM

## 2017-03-09 DIAGNOSIS — K819 Cholecystitis, unspecified: Secondary | ICD-10-CM | POA: Insufficient documentation

## 2017-03-09 DIAGNOSIS — R110 Nausea: Secondary | ICD-10-CM | POA: Diagnosis not present

## 2017-03-09 NOTE — ED Triage Notes (Signed)
The pt arrived by gems from home  He started getting nauseated around 2000 followed by abd pain and some  Lt lower arm or forearm  Lasting only seconds.  He has no lt arm involvement now no pain or numbness

## 2017-03-09 NOTE — ED Provider Notes (Signed)
[facility]EMER[facility]ENCY DEPT Provider Note   CSN: [id number] Arrival date & time: 03/09/17  2323     History   Chief Complaint Chief Complaint  Patient presents with  . Abdominal Pain    HPI Kyle Wyatt is a 46 y.o. male.  Patient presents via EMS with sudden onset of cramping abdominal pain with nausea. He states it felt like a severe cramping pain across his upper stomach that lasted about 30 minutes and has since resolved. No vomiting. He felt well throughout the day prior to this. He did have some pain and discomfort in his left lower arm lasted about a minute abdominal pains ongoing that has since resolved. Denies any chest pain or shortness of breath. Endorses feeling clammy and lightheaded. No diaphoresis. No vomiting. No diarrhea. No pain with urination. Patient states history of hypertension and previous diabetes which resolved after his gastric bypass 2 years ago. Cardiac history is negative stress test remotely. Feels back to baseline now was still some residual upper abdominal cramping.   The history is provided by the patient and the EMS personnel.  Abdominal Pain   Associated symptoms include nausea. Pertinent negatives include fever, vomiting, dysuria, hematuria, headaches, arthralgias and myalgias.    Past Medical History:  Diagnosis Date  . Apnea   . Diabetes mellitus without complication (HCC)   . High cholesterol   . Hyperlipidemia   . Hypertension   . Obesity     Patient Active Problem List   Diagnosis Date Noted  . Chest pain 02/22/2014  . Type II or unspecified type diabetes mellitus without mention of complication, not stated as uncontrolled 02/22/2014  . Essential hypertension, benign 02/22/2014  . Other and unspecified hyperlipidemia 02/22/2014  . Obesity   . Apnea     History reviewed. No pertinent surgical history.     Home Medications    Prior to Admission medications   Medication Sig Start Date End Date Taking? Authorizing Provider    HYDROcodone-acetaminophen (NORCO/VICODIN) 5-325 MG tablet Take 1 tablet by mouth every 4 (four) hours as needed. 03/10/17   Jyron Turman, Jeannett Senior, MD  predniSONE (DELTASONE) 20 MG tablet Take 3 daily for 2 days, then 2 daily for 2 days, then 1 daily for 2 days. Patient not taking: Reported on 03/10/2017 11/08/15   Dorena Bodo, PA-C    Family History Family History  Problem Relation Age of Onset  . Heart disease Father     Social History Social History  Substance Use Topics  . Smoking status: Never Smoker  . Smokeless tobacco: Never Used  . Alcohol use No     Allergies   Patient has no known allergies.   Review of Systems Review of Systems  Constitutional: Negative for activity change, appetite change and fever.  HENT: Negative for congestion and rhinorrhea.   Eyes: Negative for visual disturbance.  Respiratory: Positive for chest tightness and shortness of breath. Negative for cough.   Cardiovascular: Positive for chest pain.  Gastrointestinal: Positive for abdominal pain and nausea. Negative for vomiting.  Genitourinary: Negative for dysuria, hematuria and testicular pain.  Musculoskeletal: Negative for arthralgias and myalgias.  Skin: Negative for rash.  Neurological: Negative for dizziness, weakness, light-headedness and headaches.    all other systems are negative except as noted in the HPI and PMH.    Physical Exam Updated Vital Signs BP 117/86   Pulse (!) 55   Temp 98.2 F (36.8 C) (Oral)   Resp 14   Ht 6' 2.5" (1.892 m)  Wt 117.9 kg (260 lb)   SpO2 96%   BMI 32.94 kg/m   Physical Exam  Constitutional: He is oriented to person, place, and time. He appears well-developed and well-nourished. No distress.  HENT:  Head: Normocephalic and atraumatic.  Mouth/Throat: Oropharynx is clear and moist. No oropharyngeal exudate.  Eyes: Pupils are equal, round, and reactive to light. Conjunctivae and EOM are normal.  Neck: Normal range of motion. Neck supple.  No  meningismus.  Cardiovascular: Normal rate, regular rhythm, normal heart sounds and intact distal pulses.   No murmur heard. Pulmonary/Chest: Effort normal and breath sounds normal. No respiratory distress.  Abdominal: Soft. There is tenderness. There is guarding. There is no rebound.  TTP epigastrium and RUQ with minimal guarding  Musculoskeletal: Normal range of motion. He exhibits no edema or tenderness.  No CVAT  Neurological: He is alert and oriented to person, place, and time. No cranial nerve deficit. He exhibits normal muscle tone. Coordination normal.  No ataxia on finger to nose bilaterally. No pronator drift. 5/5 strength throughout. CN 2-12 intact.Equal grip strength. Sensation intact.   Skin: Skin is warm.  Psychiatric: He has a normal mood and affect. His behavior is normal.  Nursing note and vitals reviewed.    ED Treatments / Results  Labs (all labs ordered are listed, but only abnormal results are displayed) Labs Reviewed  COMPREHENSIVE METABOLIC PANEL - Abnormal; Notable for the following:       Result Value   Glucose, Bld 113 (*)    Calcium 8.6 (*)    AST 89 (*)    All other components within normal limits  CBC WITH DIFFERENTIAL/PLATELET  LIPASE, BLOOD  TROPONIN I  URINALYSIS, ROUTINE W REFLEX MICROSCOPIC  ETHANOL  TROPONIN I    EKG  EKG Interpretation  Date/Time:  Sunday March 09 2017 23:32:24 EDT Ventricular Rate:  72 PR Interval:    QRS Duration: 98 QT Interval:  403 QTC Calculation: 441 R Axis:   30 Text Interpretation:  Sinus rhythm Prolonged PR interval When compared with ECG of 02/22/2014, No significant change was found Confirmed by Kyle Wyatt ([id number]) on 03/09/2017 11:40:14 PM       Radiology Ct Abdomen Pelvis W Contrast  Result Date: 03/10/2017 CLINICAL DATA:  Upper abdominal pain with nausea EXAM: CT ABDOMEN AND PELVIS WITH CONTRAST TECHNIQUE: Multidetector CT imaging of the abdomen and pelvis was performed using the standard protocol  following bolus administration of intravenous contrast. CONTRAST:  ISOVUE-300 IOPAMIDOL (ISOVUE-300) INJECTION 61% COMPARISON:  Radiographs 03/10/2017 FINDINGS: Lower chest: Lung bases demonstrate no acute consolidation or pleural effusion. Heart size within normal limits. Hepatobiliary: No focal hepatic abnormality. Dilated gallbladder. No calcified stones. No biliary dilatation. Pancreas: Unremarkable. No pancreatic ductal dilatation or surrounding inflammatory changes. Spleen: Normal in size without focal abnormality. Adrenals/Urinary Tract: Adrenal glands are unremarkable. Kidneys are normal, without renal calculi, focal lesion, or hydronephrosis. Bladder is unremarkable. Stomach/Bowel: Status post gastric bypass. No evidence for a bowel obstruction. Negative appendix. Vascular/Lymphatic: No significant vascular findings are present. No enlarged abdominal or pelvic lymph nodes. Reproductive: Prostate is unremarkable. Other: Negative for free air or free fluid. Musculoskeletal: Grade 1 anterolisthesis of L4 on L5. Degenerative changes. No acute or suspicious bone lesion IMPRESSION: 1. Status post gastric bypass. No evidence for bowel obstruction or bowel wall thickening 2. Dilated gallbladder, but without calcified stones or surrounding edema. Ultrasound correlation as clinically indicated Electronically Signed   By: Jasmine Pang M.D.   On: 03/10/2017 02:30  Dg Abdomen Acute W/chest  Result Date: 03/10/2017 CLINICAL DATA:  Gastric bypass surgery with abdominal pain EXAM: DG ABDOMEN ACUTE W/ 1V CHEST COMPARISON:  02/22/2014 FINDINGS: Single-view chest demonstrates no acute consolidation or effusion. Normal heart size. No pneumothorax. Supine and upright views of the abdomen demonstrate no free air beneath the diaphragm. Nonobstructed gas pattern with scattered bowel gas in the colon. No abnormal calcification. IMPRESSION: 1. No radiographic evidence for acute cardiopulmonary abnormality. 2.  Nonobstructed bowel-gas pattern Electronically Signed   By: Jasmine PangKim  Fujinaga M.D.   On: 03/10/2017 01:00   [place] Abdomen Limited Ruq  Result Date: 03/10/2017 CLINICAL DATA:  Right upper quadrant pain EXAM: ULTRASOUND ABDOMEN LIMITED RIGHT UPPER QUADRANT COMPARISON:  CT 03/10/2017 FINDINGS: Gallbladder: Sludge and multiple small stones. Slightly thickened wall at 4.9 mm. Negative sonographic Murphy's. Common bile duct: Diameter: 4.2 mm Liver: No focal lesion identified. Within normal limits in parenchymal echogenicity. IMPRESSION: Dilated gallbladder with sludge and small stones. Mild gallbladder wall thickening present but negative sonographic Murphy's. Findings nonspecific, could be seen with acute or chronic cholecystitis. HIDA scan could be obtained as indicated. Negative for biliary dilatation. Electronically Signed   By: Jasmine PangKim  Fujinaga M.D.   On: 03/10/2017 02:35    Procedures Procedures (including critical care time)  Medications Ordered in ED Medications  iopamidol (ISOVUE-300) 61 % injection (100 mLs  Contrast Given 03/10/17 0205)     Initial Impression / Assessment and Plan / ED Course  I have reviewed the triage vital signs and the nursing notes.  Pertinent labs & imaging results that were available during my care of the patient were reviewed by me and considered in my medical decision making (see chart for details).     Upper abdominal pain with nausea now resolved. EKG without acute change. T inversion lead 3 stable  Ultrasound shows gallbladder wall thickening with stones and sludge. Discussed with Dr. Janee Mornhompson of Gen. surgery. He feels these findings are likely chronic and patient can be scheduled for outpatient cholecystectomy as he feels much better and has no further abdominal pain.  LFTs are minimally elevated. Lipase is normal. CT scan shows no complications from previous gastric bypass. No internal hernia.  Troponin negative x2.  Doubt ACS.  No complications for gastric  bypass surgery.  Patient remains pain free. Abdomen soft.   He is comfortable with outpatient follow up to schedule surgery. Return precautions discussed including worsening pain, vomiting, fever, or any other concerns.  Final Clinical Impressions(s) / ED Diagnoses   Final diagnoses:  RUQ pain  Cholecystitis    New Prescriptions New Prescriptions   HYDROCODONE-ACETAMINOPHEN (NORCO/VICODIN) 5-325 MG TABLET    Take 1 tablet by mouth every 4 (four) hours as needed.     Glynn Octaveancour, Trevaun Rendleman, MD 03/10/17 [id number]

## 2017-03-09 NOTE — ED Notes (Signed)
Pt has by-pass surgery  abd by-pass surgery

## 2017-03-09 NOTE — ED Triage Notes (Signed)
Per [facility], Pt from home. Pt had sudden onset of epigastric pain that radiated to chest and L arm. Pt also complained of nausea. Pt took 325 mg of ASA prior to EMS arrival. Pt reports pain eased off on its own. Pt rating pain 2/10. VSS.

## 2017-03-10 ENCOUNTER — Emergency Department (HOSPITAL_COMMUNITY): Payer: BLUE CROSS/BLUE SHIELD

## 2017-03-10 LAB — COMPREHENSIVE METABOLIC PANEL WITH GFR
ALT: 44 U/L (ref 17–63)
AST: 89 U/L — ABNORMAL HIGH (ref 15–41)
Albumin: 3.9 g/dL (ref 3.5–5.0)
Alkaline Phosphatase: 74 U/L (ref 38–126)
Anion gap: 10 (ref 5–15)
BUN: 17 mg/dL (ref 6–20)
CO2: 22 mmol/L (ref 22–32)
Calcium: 8.6 mg/dL — ABNORMAL LOW (ref 8.9–10.3)
Chloride: 104 mmol/L (ref 101–111)
Creatinine, Ser: 0.76 mg/dL (ref 0.61–1.24)
GFR calc Af Amer: >60 mL/min
GFR calc non Af Amer: >60 mL/min
Glucose, Bld: 113 mg/dL — ABNORMAL HIGH (ref 65–99)
Potassium: 3.8 mmol/L (ref 3.5–5.1)
Sodium: 136 mmol/L (ref 135–145)
Total Bilirubin: 0.9 mg/dL (ref 0.3–1.2)
Total Protein: 6.6 g/dL (ref 6.5–8.1)

## 2017-03-10 LAB — URINALYSIS, ROUTINE W REFLEX MICROSCOPIC
BILIRUBIN URINE: NEGATIVE
GLUCOSE, UA: NEGATIVE mg/dL
HGB URINE DIPSTICK: NEGATIVE
Ketones, ur: NEGATIVE mg/dL
Leukocytes, UA: NEGATIVE
Nitrite: NEGATIVE
PROTEIN: NEGATIVE mg/dL
Specific Gravity, Urine: 1.028 (ref 1.005–1.030)
pH: 5 (ref 5.0–8.0)

## 2017-03-10 LAB — CBC WITH DIFFERENTIAL/PLATELET
Basophils Absolute: 0 10*3/uL (ref 0.0–0.1)
Basophils Relative: 0 %
EOS ABS: 0.3 10*3/uL (ref 0.0–0.7)
Eosinophils Relative: 3 %
HEMATOCRIT: 43.6 % (ref 39.0–52.0)
HEMOGLOBIN: 14.2 g/dL (ref 13.0–17.0)
LYMPHS ABS: 2.2 10*3/uL (ref 0.7–4.0)
LYMPHS PCT: 21 %
MCH: 27.8 pg (ref 26.0–34.0)
MCHC: 32.6 g/dL (ref 30.0–36.0)
MCV: 85.3 fL (ref 78.0–100.0)
MONOS PCT: 6 %
Monocytes Absolute: 0.6 10*3/uL (ref 0.1–1.0)
NEUTROS ABS: 7.3 10*3/uL (ref 1.7–7.7)
NEUTROS PCT: 70 %
Platelets: 253 10*3/uL (ref 150–400)
RBC: 5.11 MIL/uL (ref 4.22–5.81)
RDW: 12.9 % (ref 11.5–15.5)
WBC: 10.4 10*3/uL (ref 4.0–10.5)

## 2017-03-10 LAB — LIPASE, BLOOD: Lipase: 38 U/L (ref 11–51)

## 2017-03-10 LAB — ETHANOL: Alcohol, Ethyl (B): <5 mg/dL

## 2017-03-10 LAB — TROPONIN I: Troponin I: <0.03 ng/mL

## 2017-03-10 MED ORDER — HYDROCODONE-ACETAMINOPHEN 5-325 MG PO TABS: 1.0000 | ORAL_TABLET | ORAL | 0 refills | Status: DC | PRN

## 2017-03-10 MED ORDER — IOPAMIDOL (ISOVUE-300) INJECTION 61%
INTRAVENOUS | Status: AC
  Administered 2017-03-10: 100 mL
  Filled 2017-03-10: qty 100

## 2017-03-10 NOTE — Discharge Instructions (Signed)
Avoid greasy and fatty foods. Follow up with the surgery office to schedule removal of your gallbladder. Return to the ED if you develop worsening pain, vomiting, fever, or any other concerns.

## 2017-03-10 NOTE — ED Notes (Signed)
No pain

## 2017-03-10 NOTE — ED Notes (Signed)
Pt to ct 

## 2018-01-23 ENCOUNTER — Encounter: Payer: Self-pay | Admitting: Family Medicine

## 2018-01-23 ENCOUNTER — Other Ambulatory Visit: Payer: Self-pay

## 2018-01-23 ENCOUNTER — Ambulatory Visit: Payer: BLUE CROSS/BLUE SHIELD | Admitting: Family Medicine

## 2018-01-23 VITALS — BP 118/74 | HR 63 | Temp 97.8°F | Ht 74.0 in | Wt 270.0 lb

## 2018-01-23 DIAGNOSIS — M79642 Pain in left hand: Secondary | ICD-10-CM

## 2018-01-23 DIAGNOSIS — M461 Sacroiliitis, not elsewhere classified: Secondary | ICD-10-CM

## 2018-01-23 DIAGNOSIS — M2550 Pain in unspecified joint: Secondary | ICD-10-CM

## 2018-01-23 DIAGNOSIS — M545 Low back pain, unspecified: Secondary | ICD-10-CM

## 2018-01-23 LAB — URINALYSIS, ROUTINE W REFLEX MICROSCOPIC
BACTERIA UA: NONE SEEN /HPF
Bilirubin Urine: NEGATIVE
Ketones, ur: NEGATIVE
LEUKOCYTES UA: NEGATIVE
Nitrite: NEGATIVE
PH: 5.5 (ref 5.0–8.0)
PROTEIN: NEGATIVE
SQUAMOUS EPITHELIAL / LPF: NONE SEEN /HPF (ref <=5)
Specific Gravity, Urine: 1.02 (ref 1.001–1.03)
WBC, UA: NONE SEEN /HPF (ref 0–5)

## 2018-01-23 LAB — MICROSCOPIC MESSAGE

## 2018-01-23 MED ORDER — THERMACARE BACK/HIP MISC: 1.0000 | Freq: Two times a day (BID) | 0 refills | Status: DC | PRN

## 2018-01-23 MED ORDER — TRAMADOL HCL 50 MG PO TABS: 50.0000 mg | ORAL_TABLET | Freq: Three times a day (TID) | ORAL | 0 refills | Status: AC | PRN

## 2018-01-23 MED ORDER — PREDNISONE 20 MG PO TABS
ORAL_TABLET | ORAL | 0 refills | Status: DC
Start: 2018-01-23 — End: 2018-02-18

## 2018-01-23 NOTE — Progress Notes (Signed)
Patient ID: Kyle Wyatt, male    DOB: 24-Aug-1970, 47 y.o.   MRN: [id number]  PCP: Donita Brooks, MD  Chief Complaint  Patient presents with  . Back Pain    off and on for months   . weakness and pain in left hand    symptoms for 6 months     Subjective:   Kyle Wyatt is a 46 y.o. male, presents to clinic with CC of severe back pain on and off for 6 months that has become increasingly severe and constant.  He also complains of left hand pain and weakness for 6 months, is having difficulty doing tasks with his left hand or grabbing things.   Back pain had an insidious onset, seems to be extremely exacerbated when sitting for prolonged periods of time.  Pain is located to his low right back, has become increasingly steady with constant aching pain, severity is rated 6-10 out of 10, is worse when having to sit for long periods of time he states that it so severe he "cannot function".  In the mornings it is its best and it gradually worsens throughout the day.  He works on a farm, his tasks at work throughout the day or sitting on a tractor or driving places in his car.  Recently pain is been so severe that he has to stop earlier on the day such as mid afternoon.  Denies any injury or strain.  Denies any history of arthritis, cancer, chronic steroid use.  Pain does intermittently radiate down his leg.  Frequently changing positions and avoiding seated positions does prevent pain from getting worse.  He does use a heating pad this also has been temporarily helpful.  No other alleviating or aggravating factors. He also has about 6 months of left hand pain is in all of his fingers and knuckles he has had mild swelling, decreased strength, and constant pain.  He denies any frequent or repetitive motions or tasks using his left hand or wrist.  He is never had any numbness or tingling in the fingers.  Denies any chest pain, shortness of breath, abdominal pain, numbness or weakness to bilateral  lower extremities, denies urinary incontinence, incontinence of stool, saddle anesthesia, fever, weight changes.  Patient Active Problem List   Diagnosis Date Noted  . Chest pain 02/22/2014  . Type II or unspecified type diabetes mellitus without mention of complication, not stated as uncontrolled 02/22/2014  . Essential hypertension, benign 02/22/2014  . Other and unspecified hyperlipidemia 02/22/2014  . Obesity   . Apnea      Prior to Admission medications   Not on File     No Known Allergies   Family History  Problem Relation Age of Onset  . Heart disease Father      Social History   Socioeconomic History  . Marital status: Married    Spouse name: Not on file  . Number of children: Not on file  . Years of education: Not on file  . Highest education level: Not on file  Occupational History  . Not on file  Social Needs  . Financial resource strain: Not on file  . Food insecurity:    Worry: Not on file    Inability: Not on file  . Transportation needs:    Medical: Not on file    Non-medical: Not on file  Tobacco Use  . Smoking status: Never Smoker  . Smokeless tobacco: Never Used  Substance and Sexual Activity  .  Alcohol use: No    Alcohol/week: 0.0 oz  . Drug use: No  . Sexual activity: Not on file  Lifestyle  . Physical activity:    Days per week: Not on file    Minutes per session: Not on file  . Stress: Not on file  Relationships  . Social connections:    Talks on phone: Not on file    Gets together: Not on file    Attends religious service: Not on file    Active member of club or organization: Not on file    Attends meetings of clubs or organizations: Not on file    Relationship status: Not on file  . Intimate partner violence:    Fear of current or ex partner: Not on file    Emotionally abused: Not on file    Physically abused: Not on file    Forced sexual activity: Not on file  Other Topics Concern  . Not on file  Social History  Narrative   ** Merged History Encounter **         Review of Systems  Constitutional: Negative.  Negative for activity change, appetite change, chills, diaphoresis, fatigue, fever and unexpected weight change.  HENT: Negative.   Eyes: Negative.   Respiratory: Negative.   Cardiovascular: Negative.   Gastrointestinal: Negative.   Endocrine: Negative.  Negative for cold intolerance and heat intolerance.  Genitourinary: Negative.   Musculoskeletal: Positive for arthralgias, back pain and joint swelling.  Skin: Negative.   Allergic/Immunologic: Negative.   Neurological: Positive for weakness. Negative for dizziness, tremors, light-headedness, numbness and headaches.  Hematological: Negative.   Psychiatric/Behavioral: Negative.   All other systems reviewed and are negative.      Objective:    Vitals:   01/23/18 1501  BP: 118/74  Pulse: 63  Temp: 97.8 F (36.6 C)  TempSrc: Oral  SpO2: 95%  Weight: 270 lb (122.5 kg)  Height: 6\' 2"  (1.88 m)      Physical Exam  Constitutional: He is oriented to person, place, and time. He appears well-developed and well-nourished.  Non-toxic appearance. He does not appear ill. No distress.  HENT:  Head: Normocephalic and atraumatic.  Right Ear: Tympanic membrane, external ear and ear canal normal.  Left Ear: Tympanic membrane, external ear and ear canal normal.  Nose: Nose normal. No mucosal edema or rhinorrhea. Right sinus exhibits no maxillary sinus tenderness and no frontal sinus tenderness. Left sinus exhibits no maxillary sinus tenderness and no frontal sinus tenderness.  Mouth/Throat: Uvula is midline and oropharynx is clear and moist. No trismus in the jaw. No uvula swelling. No oropharyngeal exudate, posterior oropharyngeal edema or posterior oropharyngeal erythema.  Eyes: Pupils are equal, round, and reactive to light. Conjunctivae, EOM and lids are normal.  Neck: Trachea normal, normal range of motion and phonation normal. Neck  supple. No tracheal deviation present.  Cardiovascular: Normal rate, regular rhythm, normal heart sounds, intact distal pulses and normal pulses. Exam reveals no gallop and no friction rub.  No murmur heard. Pulses:      Radial pulses are 2+ on the right side, and 2+ on the left side.       Posterior tibial pulses are 2+ on the right side, and 2+ on the left side.  Pulmonary/Chest: Effort normal and breath sounds normal. No stridor. No respiratory distress. He has no wheezes. He has no rhonchi. He has no rales. He exhibits no tenderness.  Abdominal: Soft. Normal appearance and bowel sounds are normal. He exhibits no  distension and no mass. There is no tenderness. There is no rebound and no guarding.  Musculoskeletal: He exhibits tenderness. He exhibits no deformity.       Left wrist: He exhibits normal range of motion, no tenderness, no bony tenderness, no swelling, no effusion, no crepitus and no deformity.       Cervical back: Normal.       Thoracic back: Normal.       Lumbar back: He exhibits tenderness, bony tenderness and pain. He exhibits no swelling, no edema, no deformity, no laceration and normal pulse.       Back:       Right hand: He exhibits swelling. He exhibits normal range of motion, no tenderness, no bony tenderness, normal two-point discrimination, normal capillary refill, no deformity and no laceration. Normal sensation noted. Decreased sensation is not present in the ulnar distribution, is not present in the medial distribution and is not present in the radial distribution. Normal strength noted. He exhibits no finger abduction, no thumb/finger opposition and no wrist extension trouble.  Mild diffuse swelling to left hand and fingers without erythema or induration, normal sensation to left hand in all nerve distributions, normal strength.  Normal range of motion, slightly decreased grip strength on the left, no left wrist crepitus Negative Finkelstein's test Negative Tinel's and  Phalen sign After carpal tunnel testing patient did endorse approximate 1 to 2-minute delay of paresthesias to the left hand fingers  No midline tenderness from cervical to lumbar spine, no step-off Normal gait Sensation to light touch in bilateral lower extremities  See outlined area in graphic representation of area that was tender to palpation  Negative straight leg raise bilaterally  Lymphadenopathy:    He has no cervical adenopathy.  Neurological: He is alert and oriented to person, place, and time. He displays no atrophy and no tremor. No sensory deficit. He exhibits normal muscle tone. Coordination and gait normal.  Skin: Skin is warm, dry and intact. Capillary refill takes less than 2 seconds. No rash noted. He is not diaphoretic.  Psychiatric: He has a normal mood and affect. His speech is normal and behavior is normal.  Nursing note and vitals reviewed.         Assessment & Plan:      ICD-10-CM   1. Right-sided low back pain without sciatica, unspecified chronicity M54.5 Urinalysis, Routine w reflex microscopic    Rheumatoid Arthritis Diagnostic Panel, Comprehensive    Sedimentation Rate    C-reactive protein    predniSONE (DELTASONE) 20 MG tablet    Heat Wraps (THERMACARE BACK/HIP) MISC    traMADol (ULTRAM) 50 MG tablet    DG Lumbar Spine Complete    Uric Acid    BASIC METABOLIC PANEL WITH GFR    CBC with Differential    Microscopic Message    DG Si Joints  2. Left hand pain M79.642 Rheumatoid Arthritis Diagnostic Panel, Comprehensive    Sedimentation Rate    C-reactive protein    TSH    predniSONE (DELTASONE) 20 MG tablet    Uric Acid    BASIC METABOLIC PANEL WITH GFR    CBC with Differential  3. Polyarthralgia M25.50 Rheumatoid Arthritis Diagnostic Panel, Comprehensive    Sedimentation Rate    C-reactive protein    TSH    predniSONE (DELTASONE) 20 MG tablet    Uric Acid    BASIC METABOLIC PANEL WITH GFR    CBC with Differential    DG Si Joints  4.  Sacroiliitis (HCC) M46.1 DG Si Joints    47 year old male, delayed onset with complaints of multiple painful joints and pain in back x 6 months, gradually worsening, currently so severe causing him to be unable to work beyond mid afternoon, causing weakness and inability to grab things with his left hand.  He hardly ever comes into the office ill, he is a Visual merchandiser, is used to demanding physical labor.   Suspect he is only coming now because of truly severe sx that have not improved at home and are now limiting livelihood/farming. Patient is new to me, has not been seen by his PCP since April 2017. Has a history of gastric bypass, cannot take NSAIDs.  Was has a past medical history of hypertension, hyperlipidemia, type 2 diabetes, prior to being seen today has not been on any medications except for baby aspirin and Tylenol, it appears that patient lost a significant amount of weight with weight loss surgery and all issues were controlled with weight loss diet and exercise. Based on his physical exam findings feel that he may have some left lumbosacral radiculopathy and sacroiliitis of the left.  Feel that treatment with steroids is indicated.  No red flags regarding back pain currently have no concern for cauda equina and he has no neurological deficits on exam, no saddle anesthesia or urinary incontinence.   Will get plain films initially to evaluate for any degenerative changes.  Treat with steroid burst, patient unfortunately cannot take NSAIDs, pain medication and muscle relaxers for severe pain.  Advised to get portable heat patches to help give him localized heat therapy while working.  He may benefit from physical therapy and may need MRI. Left hand symptoms are much more vague, there is no clear focal inflamed joints or knuckles which would be more suggestive of one arthritis and another.  All movement with his finger and hands he has no range of motion or strength deficit but with grip strength he is  slightly decreased and I believe this is secondary to pain.  No positive exam findings for de Quervain's tenosynovitis or carpal tunnel.  Given multiple areas of joint inflammation and pain will initiate basic lab work to evaluate for arthritis.  BMP to evaluate kidney function, CBC to rule out any concerning leukocytosis although he does not look like any focal infection at all and I have no concern for any septic joints.  CRP and sed rate for acute inflammatory markers.  RA panel and uric acid level (to r/o possibly gout with hand/knuckle pain).     Danelle Berry, PA-C 01/28/18 1:30 PM

## 2018-01-23 NOTE — Patient Instructions (Signed)
Arthritis Arthritis means joint pain. It can also mean joint disease. A joint is a place where bones come together. People who have arthritis may have:  Red joints.  Swollen joints.  Stiff joints.  Warm joints.  A fever.  A feeling of being sick.  Follow these instructions at home: Pay attention to any changes in your symptoms. Take these actions to help with your pain and swelling. Medicines  Take over-the-counter and prescription medicines only as told by your doctor.  Do not take aspirin for pain if your doctor says that you may have gout. Activity  Rest your joint if your doctor tells you to.  Avoid activities that make the pain worse.  Exercise your joint regularly as told by your doctor. Try doing exercises like: ? Swimming. ? Water aerobics. ? Biking. ? Walking. Joint Care   If your joint is swollen, keep it raised (elevated) if told by your doctor.  If your joint feels stiff in the morning, try taking a warm shower.  If you have diabetes, do not apply heat without asking your doctor.  If told, apply heat to the joint: ? Put a towel between the joint and the hot pack or heating pad. ? Leave the heat on the area for 20-30 minutes.  If told, apply ice to the joint: ? Put ice in a plastic bag. ? Place a towel between your skin and the bag. ? Leave the ice on for 20 minutes, 2-3 times per day.  Keep all follow-up visits as told by your doctor. Contact a doctor if:  The pain gets worse.  You have a fever. Get help right away if:  You have very bad pain in your joint.  You have swelling in your joint.  Your joint is red.  Many joints become painful and swollen.  You have very bad back pain.  Your leg is very weak.  You cannot control your pee (urine) or poop (stool). This information is not intended to replace advice given to you by your health care provider. Make sure you discuss any questions you have with your health care provider. Document  Released: 10/09/2009 Document Revised: 12/21/2015 Document Reviewed: 10/10/2014 Elsevier Interactive Patient Education  2018 [employer].    Sacroiliac Joint Dysfunction Sacroiliac joint dysfunction is a condition that causes inflammation on one or both sides of the sacroiliac (SI) joint. The SI joint connects the lower part of the spine (sacrum) with the two upper portions of the pelvis (ilium). This condition causes deep aching or burning pain in the low back. In some cases, the pain may also spread into one or both buttocks or hips or spread down the legs. What are the causes? This condition may be caused by:  Pregnancy. During pregnancy, extra stress is put on the SI joints because the pelvis widens.  Injury, such as: ? Car accidents. ? Sport-related injuries. ? Work-related injuries.  Having one leg that is shorter than the other.  Conditions that affect the joints, such as: ? Rheumatoid arthritis. ? Gout. ? Psoriatic arthritis. ? Joint infection (septic arthritis).  Sometimes, the cause of SI joint dysfunction is not known. What are the signs or symptoms? Symptoms of this condition include:  Aching or burning pain in the lower back. The pain may also spread to other areas, such as: ? Buttocks. ? Groin. ? Thighs and legs.  Muscle spasms in or around the painful areas.  Increased pain when standing, walking, running, stair climbing, bending, or lifting.  How is this diagnosed? Your health care provider will do a physical exam and take your medical history. During the exam, the health care provider may move one or both of your legs to different positions to check for pain. Various tests may be done to help verify the diagnosis, including:  Imaging tests to look for other causes of pain. These may include: ? MRI. ? CT scan. ? Bone scan.  Diagnostic injection. A numbing medicine is injected into the SI joint using a needle. If the pain is temporarily improved or  stopped after the injection, this can indicate that SI joint dysfunction is the problem.  How is this treated? Treatment may vary depending on the cause and severity of your condition. Treatment options may include:  Applying ice or heat to the lower back area. This can help to reduce pain and muscle spasms.  Medicines to relieve pain or inflammation or to relax the muscles.  Wearing a back brace (sacroiliac brace) to help support the joint while your back is healing.  Physical therapy to increase muscle strength around the joint and flexibility at the joint. This may also involve learning proper body positions and ways of moving to relieve stress on the joint.  Direct manipulation of the SI joint.  Injections of steroid medicine into the joint in order to reduce pain and swelling.  Radiofrequency ablation to burn away nerves that are carrying pain messages from the joint.  Use of a device that provides electrical stimulation in order to reduce pain at the joint.  Surgery to put in screws and plates that limit or prevent joint motion. This is rare.  Follow these instructions at home:  Rest as needed. Limit your activities as directed by your health care provider.  Take medicines only as directed by your health care provider.  If directed, apply ice to the affected area: ? Put ice in a plastic bag. ? Place a towel between your skin and the bag. ? Leave the ice on for 20 minutes, 2-3 times per day.  Use a heating pad or a moist heat pack as directed by your health care provider.  Exercise as directed by your health care provider or physical therapist.  Keep all follow-up visits as directed by your health care provider. This is important. Contact a health care provider if:  Your pain is not controlled with medicine.  You have a fever.  You have increasingly severe pain. Get help right away if:  You have weakness, numbness, or tingling in your legs or feet.  You lose  control of your bladder or bowel. This information is not intended to replace advice given to you by your health care provider. Make sure you discuss any questions you have with your health care provider. Document Released: 10/11/2008 Document Revised: 12/21/2015 Document Reviewed: 03/22/2014 Elsevier Interactive Patient Education  2018 [employer] Back Pain, Adult Many adults have back pain from time to time. Common causes of back pain include:  A strained muscle or ligament.  Wear and tear (degeneration) of the spinal disks.  Arthritis.  A hit to the back.  Back pain can be short-lived (acute) or last a long time (chronic). A physical exam, lab tests, and imaging studies may be done to find the cause of your pain. Follow these instructions at home: Managing pain and stiffness  Take over-the-counter and prescription medicines only as told by your health care provider.  If directed, apply heat to the affected area as often as  told by your health care provider. Use the heat source that your health care provider recommends, such as a moist heat pack or a heating pad. ? Place a towel between your skin and the heat source. ? Leave the heat on for 20-30 minutes. ? Remove the heat if your skin turns bright red. This is especially important if you are unable to feel pain, heat, or cold. You have a greater risk of getting burned.  If directed, apply ice to the injured area: ? Put ice in a plastic bag. ? Place a towel between your skin and the bag. ? Leave the ice on for 20 minutes, 2-3 times a day for the first 2-3 days. Activity  Do not stay in bed. Resting more than 1-2 days can delay your recovery.  Take short walks on even surfaces as soon as you are able. Try to increase the length of time you walk each day.  Do not sit, drive, or stand in one place for more than 30 minutes at a time. Sitting or standing for long periods of time can put stress on your back.  Use proper lifting  techniques. When you bend and lift, use positions that put less stress on your back: ? Helen your knees. ? Keep the load close to your body. ? Avoid twisting.  Exercise regularly as told by your health care provider. Exercising will help your back heal faster. This also helps prevent back injuries by keeping muscles strong and flexible.  Your health care provider may recommend that you see a physical therapist. This person can help you come up with a safe exercise program. Do any exercises as told by your physical therapist. Lifestyle  Maintain a healthy weight. Extra weight puts stress on your back and makes it difficult to have good posture.  Avoid activities or situations that make you feel anxious or stressed. Learn ways to manage anxiety and stress. One way to manage stress is through exercise. Stress and anxiety increase muscle tension and can make back pain worse. General instructions  Sleep on a firm mattress in a comfortable position. Try lying on your side with your knees slightly bent. If you lie on your back, put a pillow under your knees.  Follow your treatment plan as told by your health care provider. This may include: ? Cognitive or behavioral therapy. ? Acupuncture or massage therapy. ? Meditation or yoga. Contact a health care provider if:  You have pain that is not relieved with rest or medicine.  You have increasing pain going down into your legs or buttocks.  Your pain does not improve in 2 weeks.  You have pain at night.  You lose weight.  You have a fever or chills. Get help right away if:  You develop new bowel or bladder control problems.  You have unusual weakness or numbness in your arms or legs.  You develop nausea or vomiting.  You develop abdominal pain.  You feel faint. Summary  Many adults have back pain from time to time. A physical exam, lab tests, and imaging studies may be done to find the cause of your pain.  Use proper lifting  techniques. When you bend and lift, use positions that put less stress on your back.  Take over-the-counter and prescription medicines and apply heat or ice as directed by your health care provider. This information is not intended to replace advice given to you by your health care provider. Make sure you discuss any questions you  have with your health care provider. Document Released: 07/15/2005 Document Revised: 08/19/2016 Document Reviewed: 08/19/2016 Elsevier Interactive Patient Education  [employer].

## 2018-01-26 ENCOUNTER — Ambulatory Visit
Admission: RE | Admit: 2018-01-26 | Discharge: 2018-01-26 | Disposition: A | Discharge status: Home / Self Care | Payer: BLUE CROSS/BLUE SHIELD | Source: Ambulatory Visit | Attending: Family Medicine | Admitting: Family Medicine

## 2018-01-27 ENCOUNTER — Telehealth: Payer: Self-pay

## 2018-01-27 NOTE — Telephone Encounter (Signed)
Call placed to pharmacy and they are aware they can use 5mg 

## 2018-01-27 NOTE — Telephone Encounter (Signed)
Received fax from [facility] pharmacy that prednisone 20 mg and 10mg  is on backorder and would like to switch to 5mg  is that ok? Pls advise

## 2018-01-27 NOTE — Telephone Encounter (Signed)
Okay to rx with 5 mg tabs

## 2018-01-28 LAB — CBC WITH DIFFERENTIAL/PLATELET
BASOS ABS: 50 {cells}/uL (ref 0–200)
BASOS PCT: 0.7 %
EOS ABS: 320 {cells}/uL (ref 15–500)
Eosinophils Relative: 4.5 %
HCT: 43.9 % (ref 38.5–50.0)
Hemoglobin: 15.1 g/dL (ref 13.2–17.1)
Lymphs Abs: 2038 cells/uL (ref 850–3900)
MCH: 28.3 pg (ref 27.0–33.0)
MCHC: 34.4 g/dL (ref 32.0–36.0)
MCV: 82.2 fL (ref 80.0–100.0)
MONOS PCT: 9.9 %
MPV: 9.9 fL (ref 7.5–12.5)
NEUTROS PCT: 56.2 %
Neutro Abs: 3990 cells/uL (ref 1500–7800)
PLATELETS: 281 10*3/uL (ref 140–400)
RBC: 5.34 10*6/uL (ref 4.20–5.80)
RDW: 12.5 % (ref 11.0–15.0)
Total Lymphocyte: 28.7 %
WBC: 7.1 10*3/uL (ref 3.8–10.8)
WBCMIX: 703 {cells}/uL (ref 200–950)

## 2018-01-28 LAB — SEDIMENTATION RATE: SED RATE: 2 mm/h (ref 0–15)

## 2018-01-28 LAB — BASIC METABOLIC PANEL WITH GFR
BUN: 15 mg/dL (ref 7–25)
CO2: 25 mmol/L (ref 20–32)
CREATININE: 0.68 mg/dL (ref 0.60–1.35)
Calcium: 9.3 mg/dL (ref 8.6–10.3)
Chloride: 102 mmol/L (ref 98–110)
GFR, EST AFRICAN AMERICAN: 130 mL/min/{1.73_m2} (ref >=60)
GFR, EST NON AFRICAN AMERICAN: 112 mL/min/{1.73_m2} (ref >=60)
Glucose, Bld: 94 mg/dL (ref 65–99)
POTASSIUM: 4.1 mmol/L (ref 3.5–5.3)
Sodium: 139 mmol/L (ref 135–146)

## 2018-01-28 LAB — RHEUMATOID ARTHRITIS DIAGNOSTIC PANEL, COMPREHENSIVE
Cyclic Citrullin Peptide Ab: <16 Units (ref <20)
Rheumatoid Factor (IgG): <5 U (ref <=6)

## 2018-01-28 LAB — URIC ACID: Uric Acid, Serum: 4.1 mg/dL (ref 4.0–8.0)

## 2018-01-28 LAB — TSH: TSH: 1.81 m[IU]/L (ref 0.40–4.50)

## 2018-01-28 LAB — C-REACTIVE PROTEIN: CRP: 0.6 mg/L (ref <8.0)

## 2018-01-28 NOTE — Progress Notes (Signed)
Xrays of lumbar spine show osteoarthritis changes.  Si joints (joints from sacral spine to iliac joints in pelvis) show signs of inflammation.  All labs have resulted except the rheumatic arthritis panel, however all other labs are reassuring, white count was normal kidney function was normal, normal uric acid, normal acute inflammatory markers which are sed rate and CRP.  Thyroid was normal.  There was blood and sugar in the urine which is not normal and which we would like to rescreen.  I will order physical therapy, patient should return to follow-up with his PCP in 1 to 2 weeks for recheck.  Then appropriate treatment can be initiated from there - ortho, rheumology, MRI's etc.  If for some reason the [facility] panel does not result, we can repeat basic labs for PCP and recheck urine.  There was some delay in getting steroids filled at pharmacy.  Can pt follow up in 2 weeks or late next week?  How is he feeling?

## 2018-01-28 NOTE — Progress Notes (Signed)
Rh panel was also negative.  May be DDD and osteoarthritis, PT may help.  Can defer to Dr. Tanya NonesPickard at upcoming appt

## 2018-02-06 ENCOUNTER — Ambulatory Visit: Payer: BLUE CROSS/BLUE SHIELD | Admitting: Family Medicine

## 2018-02-06 ENCOUNTER — Encounter: Payer: Self-pay | Admitting: Family Medicine

## 2018-02-06 VITALS — BP 120/74 | HR 68 | Temp 98.2°F | Resp 16 | Ht 74.0 in | Wt 265.0 lb

## 2018-02-06 DIAGNOSIS — M461 Sacroiliitis, not elsewhere classified: Secondary | ICD-10-CM | POA: Diagnosis not present

## 2018-02-06 DIAGNOSIS — M4316 Spondylolisthesis, lumbar region: Secondary | ICD-10-CM

## 2018-02-06 DIAGNOSIS — E118 Type 2 diabetes mellitus with unspecified complications: Secondary | ICD-10-CM

## 2018-02-06 DIAGNOSIS — I10 Essential (primary) hypertension: Secondary | ICD-10-CM

## 2018-02-06 DIAGNOSIS — E7800 Pure hypercholesterolemia, unspecified: Secondary | ICD-10-CM

## 2018-02-06 NOTE — Progress Notes (Signed)
Subjective:    Patient ID: Kyle Wyatt, male    DOB: March 02, 1971, 47 y.o.   MRN: [id number]  HPI  I have not seen this patient in several years.  Recently was in the clinic due to low back pain.  The pain was located near the SI joint on the right side.  He states the pain was constant and causing him severe pain.  X-rays were obtained of his back which revealed spondylolisthesis at L4-L5.  However the patient's pain was not located in the center of his back it was to the right side.  Furthermore there was no radiation of the pain down his right leg.  He denies any weakness in his right leg or numbness in his right leg or neuropathic pain in his right leg.  X-rays were also obtained of the sacroiliac joints and there was evidence of sacroiliitis.  Therefore my partner obtain lab work to evaluate for autoimmune diseases including rheumatoid factor, and anti-CCP, anti-Ro anti-LA, ESR.  All of this lab work was negative suggesting against an autoimmune disease.  Patient was placed on prednisone and his pain has improved dramatically.  However he is here to follow-up trace hematuria seen in his urinalysis that day and also trace sugar seen in his urinalysis that day.  Of note his random sugar was 96.  He does have a history of diabetes mellitus type 2 but after the patient underwent gastric bypass surgery and lost substantial weight, he has been diet controlled.  He is overdue for fasting lab work Past Medical History:  Diagnosis Date  . Apnea   . Diabetes mellitus without complication ([employer])   . High cholesterol   . Hyperlipidemia   . Hypertension   . Obesity    No past surgical history on file. Current Outpatient Medications on File Prior to Visit  Medication Sig Dispense Refill  . Heat Wraps (THERMACARE BACK/HIP) MISC 1 patch by Does not apply route 2 (two) times daily as needed. 5 each 0  . predniSONE (DELTASONE) 20 MG tablet 2 tabs poqday 1-5, 1 tabs poqday 6-10 15 tablet 0   No current  facility-administered medications on file prior to visit.    No Known Allergies Social History   Socioeconomic History  . Marital status: Married    Spouse name: Not on file  . Number of children: Not on file  . Years of education: Not on file  . Highest education level: Not on file  Occupational History  . Not on file  Social Needs  . Financial resource strain: Not on file  . Food insecurity:    Worry: Not on file    Inability: Not on file  . Transportation needs:    Medical: Not on file    Non-medical: Not on file  Tobacco Use  . Smoking status: Never Smoker  . Smokeless tobacco: Never Used  Substance and Sexual Activity  . Alcohol use: No    Alcohol/week: 0.0 oz  . Drug use: No  . Sexual activity: Not on file  Lifestyle  . Physical activity:    Days per week: Not on file    Minutes per session: Not on file  . Stress: Not on file  Relationships  . Social connections:    Talks on phone: Not on file    Gets together: Not on file    Attends religious service: Not on file    Active member of club or organization: Not on file    Attends  meetings of clubs or organizations: Not on file    Relationship status: Not on file  . Intimate partner violence:    Fear of current or ex partner: Not on file    Emotionally abused: Not on file    Physically abused: Not on file    Forced sexual activity: Not on file  Other Topics Concern  . Not on file  Social History Narrative   ** Merged History Encounter **          Review of Systems  All other systems reviewed and are negative.      Objective:   Physical Exam  Constitutional: He appears well-developed and well-nourished.  Cardiovascular: Normal rate, regular rhythm and normal heart sounds. Exam reveals no gallop and no friction rub.  No murmur heard. Pulmonary/Chest: Effort normal and breath sounds normal. No respiratory distress. He has no wheezes. He has no rales. He exhibits no tenderness.  Abdominal: Soft. Bowel  sounds are normal. He exhibits no distension and no mass. There is no tenderness. There is no rebound and no guarding.  Musculoskeletal: He exhibits no edema.       Lumbar back: He exhibits tenderness and pain. He exhibits normal range of motion, no bony tenderness and no spasm.       Back:  Vitals reviewed.         Assessment & Plan:  Essential hypertension, benign - Plan: BASIC METABOLIC PANEL WITH GFR, CBC with Differential/Platelet, Lipid panel, Microalbumin, urine, Hemoglobin A1c, Urinalysis, Routine w reflex microscopic  Pure hypercholesterolemia - Plan: BASIC METABOLIC PANEL WITH GFR, CBC with Differential/Platelet, Lipid panel, Microalbumin, urine, Hemoglobin A1c, Urinalysis, Routine w reflex microscopic  Controlled type 2 diabetes mellitus with complication, without long-term current use of insulin (HCC) - Plan: BASIC METABOLIC PANEL WITH GFR, CBC with Differential/Platelet, Lipid panel, Microalbumin, urine, Hemoglobin A1c, Urinalysis, Routine w reflex microscopic  Sacroiliitis, spondylolisthesis of L4 on L5, hematuria, glucosuria   Spent more than 25 minutes today with the patient discussing his conditions.  His blood pressure is well controlled.  No medication is necessary at this time.  I will check a fasting lipid panel.  As long as his LDL cholesterol is less than 100, I would not begin a statin medication unless his sugar is elevated.  Patient did have trace glucosuria however his random sugar was normal.  I will check hemoglobin A1c.  If his hemoglobin A1c is normal, I believe that the glucosuria was likely contaminant.  Regarding his hematuria, this is been an isolated episode.  I would repeat a urinalysis.  If there is persistent hematuria, we may need to consult urology for cystoscopy and also obtain imaging of the kidneys with a CT of the abdomen and pelvis.  However I believe this is most likely benign.  Regarding his sacroiliitis, his pain is subsided dramatically after  the prednisone.  He is unable to take NSAIDs due to his gastric bypass.  If the pain returns, I would refer the patient to orthopedics for possible SI joint injections.  Lab work suggest against autoimmune diseases.  Especially given his normal sed rate.

## 2018-02-09 ENCOUNTER — Other Ambulatory Visit: Payer: BLUE CROSS/BLUE SHIELD

## 2018-02-09 LAB — URINALYSIS, ROUTINE W REFLEX MICROSCOPIC
BACTERIA UA: NONE SEEN /HPF
BILIRUBIN URINE: NEGATIVE
Glucose, UA: NEGATIVE
HGB URINE DIPSTICK: NEGATIVE
KETONES UR: NEGATIVE
LEUKOCYTES UA: NEGATIVE
NITRITE: NEGATIVE
RBC / HPF: NONE SEEN /HPF (ref 0–2)
SPECIFIC GRAVITY, URINE: 1.02 (ref 1.001–1.03)
SQUAMOUS EPITHELIAL / LPF: NONE SEEN /HPF (ref <=5)
WBC UA: NONE SEEN /HPF (ref 0–5)
pH: 7 (ref 5.0–8.0)

## 2018-02-09 LAB — MICROSCOPIC MESSAGE

## 2018-02-10 ENCOUNTER — Encounter (INDEPENDENT_AMBULATORY_CARE_PROVIDER_SITE_OTHER): Payer: Self-pay

## 2018-02-10 ENCOUNTER — Encounter: Payer: Self-pay | Admitting: Family Medicine

## 2018-02-10 LAB — BASIC METABOLIC PANEL WITH GFR
BUN: 15 mg/dL (ref 7–25)
CO2: 28 mmol/L (ref 20–32)
CREATININE: 0.73 mg/dL (ref 0.60–1.35)
Calcium: 9.1 mg/dL (ref 8.6–10.3)
Chloride: 104 mmol/L (ref 98–110)
GFR, EST AFRICAN AMERICAN: 126 mL/min/{1.73_m2} (ref >=60)
GFR, EST NON AFRICAN AMERICAN: 109 mL/min/{1.73_m2} (ref >=60)
Glucose, Bld: 106 mg/dL — ABNORMAL HIGH (ref 65–99)
Potassium: 4.8 mmol/L (ref 3.5–5.3)
Sodium: 139 mmol/L (ref 135–146)

## 2018-02-10 LAB — CBC WITH DIFFERENTIAL/PLATELET
BASOS ABS: 53 {cells}/uL (ref 0–200)
Basophils Relative: 0.7 %
Eosinophils Absolute: 236 cells/uL (ref 15–500)
Eosinophils Relative: 3.1 %
HEMATOCRIT: 43.3 % (ref 38.5–50.0)
Hemoglobin: 14.2 g/dL (ref 13.2–17.1)
LYMPHS ABS: 2242 {cells}/uL (ref 850–3900)
MCH: 28.1 pg (ref 27.0–33.0)
MCHC: 32.8 g/dL (ref 32.0–36.0)
MCV: 85.6 fL (ref 80.0–100.0)
MPV: 9.3 fL (ref 7.5–12.5)
Monocytes Relative: 9.2 %
NEUTROS PCT: 57.5 %
Neutro Abs: 4370 cells/uL (ref 1500–7800)
Platelets: 276 10*3/uL (ref 140–400)
RBC: 5.06 10*6/uL (ref 4.20–5.80)
RDW: 12.8 % (ref 11.0–15.0)
Total Lymphocyte: 29.5 %
WBC: 7.6 10*3/uL (ref 3.8–10.8)
WBCMIX: 699 {cells}/uL (ref 200–950)

## 2018-02-10 LAB — LIPID PANEL
CHOL/HDL RATIO: 4.2 (calc) (ref <5.0)
Cholesterol: 161 mg/dL (ref <200)
HDL: 38 mg/dL — ABNORMAL LOW (ref >40)
LDL Cholesterol (Calc): 101 mg/dL (calc) — ABNORMAL HIGH
NON-HDL CHOLESTEROL (CALC): 123 mg/dL (ref <130)
TRIGLYCERIDES: 119 mg/dL (ref <150)

## 2018-02-10 LAB — HEMOGLOBIN A1C
EAG (MMOL/L): 6.6 (calc)
Hgb A1c MFr Bld: 5.8 % of total Hgb — ABNORMAL HIGH (ref <5.7)
Mean Plasma Glucose: 120 (calc)

## 2018-02-10 LAB — MICROALBUMIN, URINE: MICROALB UR: 0.3 mg/dL

## 2018-02-18 ENCOUNTER — Encounter: Payer: Self-pay | Admitting: Physician Assistant

## 2018-02-18 ENCOUNTER — Ambulatory Visit: Payer: BLUE CROSS/BLUE SHIELD | Admitting: Physician Assistant

## 2018-02-18 VITALS — BP 108/72 | HR 67 | Temp 97.7°F | Resp 18 | Ht 74.0 in | Wt 268.4 lb

## 2018-02-18 DIAGNOSIS — H01001 Unspecified blepharitis right upper eyelid: Secondary | ICD-10-CM

## 2018-02-18 DIAGNOSIS — H00011 Hordeolum externum right upper eyelid: Secondary | ICD-10-CM

## 2018-02-18 MED ORDER — BACITRACIN-POLYMYXIN B 500-10000 UNIT/GM OP OINT: 1.0000 "application " | TOPICAL_OINTMENT | Freq: Four times a day (QID) | OPHTHALMIC | 0 refills | Status: AC

## 2018-02-18 NOTE — Progress Notes (Signed)
    Patient ID: Kyle Wyatt MRN: [id number], DOB: [birth year], 47 y.o. Date of Encounter: 02/18/2018, 9:28 AM    Chief Complaint:  Chief Complaint  Patient presents with  . Right eye lid swollen     HPI: 47 y.o. year old male presents with above.   Reports that when he woke up yesterday morning he noticed that the right upper eyelid was swollen. This morning when he woke up he noticed some mucousy drainage in the right eye.  Has noticed no other signs or symptoms. Has no other concerns to address. Has tried no treatment.     Home Meds:   Outpatient Medications Prior to Visit  Medication Sig Dispense Refill  . Heat Wraps (THERMACARE BACK/HIP) MISC 1 patch by Does not apply route 2 (two) times daily as needed. 5 each 0  . predniSONE (DELTASONE) 20 MG tablet 2 tabs poqday 1-5, 1 tabs poqday 6-10 15 tablet 0   No facility-administered medications prior to visit.     Allergies: No Known Allergies    Review of Systems: See HPI for pertinent ROS. All other ROS negative.    Physical Exam: Blood pressure 108/72, pulse 67, temperature 97.7 F (36.5 C), temperature source Oral, resp. rate 18, height 6\' 2"  (1.88 m), weight 121.7 kg (268 lb 6.4 oz), SpO2 97 %., Body mass index is 34.46 kg/m. General: WNWD WM.  Appears in no acute distress. HEENT: Left Eye appears normal. The conjunctiva appears normal. The upper and lower eyelid appear normal.  The skin of his face is normal with no rash. No rash on nose.  Right Eye: conjunctiva appears normal. Lower eyelid appears normal. Upper eyelid with mild diffuse erythema, mild diffuse swelling. Possible small papule c/w hordeolum as well.  Neck: Supple. No thyromegaly. No lymphadenopathy. Lungs: Clear bilaterally to auscultation without wheezes, rales, or rhonchi. Breathing is unlabored. Heart: Regular rhythm. No murmurs, rubs, or gallops. Msk:  Strength and tone normal for age. Extremities/Skin: Warm and dry.  Neuro: Alert and  oriented X 3. Moves all extremities spontaneously. Gait is normal. CNII-XII grossly in tact. Psych:  Responds to questions appropriately with a normal affect.     ASSESSMENT AND PLAN:  47 y.o. year old male with   1. Blepharitis of right upper eyelid, unspecified type Findings are consistent with blepharitis and possible small hordeolum. He is to apply [employer] baby shampoo to right upper eye lid and then rinse this.  He is to then apply warm compress.  Once this dries, then apply the Polysporin ointment across upper eyelid at the base of eyelashes.  He is to repeat this process 4 times daily. He is to follow-up if site worsens or does not resolve in 1 week.  He voices understanding and agrees. - bacitracin-polymyxin b (POLYSPORIN) ophthalmic ointment; Place 1 application into the right eye 4 (four) times daily for 7 days.  Dispense: 3.5 g; Refill: 0  2. Hordeolum externum of right upper eyelid - bacitracin-polymyxin b (POLYSPORIN) ophthalmic ointment; Place 1 application into the right eye 4 (four) times daily for 7 days.  Dispense: 3.5 g; Refill: 0   Signed, [address]Mary Beth Black CreekDixon, [place]PA, [facility] 02/18/2018 9:28 AM

## 2018-02-19 ENCOUNTER — Other Ambulatory Visit: Payer: Self-pay | Admitting: Family Medicine

## 2018-02-19 MED ORDER — SULFAMETHOXAZOLE-TRIMETHOPRIM 800-160 MG PO TABS: 1.0000 | ORAL_TABLET | Freq: Two times a day (BID) | ORAL | 0 refills | Status: DC

## 2018-02-26 ENCOUNTER — Encounter: Payer: Self-pay | Admitting: Family Medicine

## 2018-02-26 ENCOUNTER — Ambulatory Visit: Payer: BLUE CROSS/BLUE SHIELD | Admitting: Family Medicine

## 2018-02-26 VITALS — BP 100/70 | HR 70 | Temp 98.1°F | Resp 14 | Ht 74.0 in | Wt 266.0 lb

## 2018-02-26 DIAGNOSIS — H00011 Hordeolum externum right upper eyelid: Secondary | ICD-10-CM | POA: Diagnosis not present

## 2018-02-26 NOTE — Progress Notes (Signed)
Subjective:    Patient ID: Kyle Wyatt, male    DOB: 08/21/1970, 47 y.o.   MRN: [id number]  HPI Patient has a large hordeolum of his right upper eyelid.  It is been present for approximately 1 week.  It was initially treated his blepharitis with topical antibiotic drops but he was switched to oral Bactrim after 24 hours as the redness and swelling worsened.  The redness and swelling has improved and now there is a large 5 mm localized fluctuant nodule in his right upper eyelid that is tender and painful.  It is causing pressure but no pain in the.  He denies any vision changes Past Medical History:  Diagnosis Date  . Apnea   . Diabetes mellitus without complication (HCC)   . High cholesterol   . Hyperlipidemia   . Hypertension   . Obesity    No past surgical history on file. Current Outpatient Medications on File Prior to Visit  Medication Sig Dispense Refill  . Heat Wraps (THERMACARE BACK/HIP) MISC 1 patch by Does not apply route 2 (two) times daily as needed. 5 each 0  . sulfamethoxazole-trimethoprim (BACTRIM DS,SEPTRA DS) 800-160 MG tablet Take 1 tablet by mouth 2 (two) times daily. 14 tablet 0   No current facility-administered medications on file prior to visit.    No Known Allergies Social History   Socioeconomic History  . Marital status: Married    Spouse name: Not on file  . Number of children: Not on file  . Years of education: Not on file  . Highest education level: Not on file  Occupational History  . Not on file  Social Needs  . Financial resource strain: Not on file  . Food insecurity:    Worry: Not on file    Inability: Not on file  . Transportation needs:    Medical: Not on file    Non-medical: Not on file  Tobacco Use  . Smoking status: Never Smoker  . Smokeless tobacco: Never Used  Substance and Sexual Activity  . Alcohol use: No    Alcohol/week: 0.0 oz  . Drug use: No  . Sexual activity: Not on file  Lifestyle  . Physical activity:    Days  per week: Not on file    Minutes per session: Not on file  . Stress: Not on file  Relationships  . Social connections:    Talks on phone: Not on file    Gets together: Not on file    Attends religious service: Not on file    Active member of club or organization: Not on file    Attends meetings of clubs or organizations: Not on file    Relationship status: Not on file  . Intimate partner violence:    Fear of current or ex partner: Not on file    Emotionally abused: Not on file    Physically abused: Not on file    Forced sexual activity: Not on file  Other Topics Concern  . Not on file  Social History Narrative   ** Merged History Encounter **          Review of Systems  All other systems reviewed and are negative.      Objective:   Physical Exam  Constitutional: He appears well-developed and well-nourished.  Eyes: Pupils are equal, round, and reactive to light. Conjunctivae and EOM are normal. Right eye exhibits hordeolum.    Cardiovascular: Normal heart sounds.  Vitals reviewed.  Assessment & Plan:  Hordeolum externum of right upper eyelid  Patient has tried and failed conservative treatments including antibiotic ointment, oral Bactrim double strength tablets, warm compresses.  There is a localized abscess in the right upper eyelid that requires incision and drainage.  Will consult ophthalmology as soon as possible to have this done.

## 2018-12-22 IMAGING — CR DG SI JOINTS 3+V
4 series · 4 of 4 positions shown · non-contrast
Comparison: None.

CLINICAL DATA: Chronic pain

EXAM:
BILATERAL SACROILIAC JOINTS - 3+ VIEW

[t sacroiliac joints (1 of 4)]
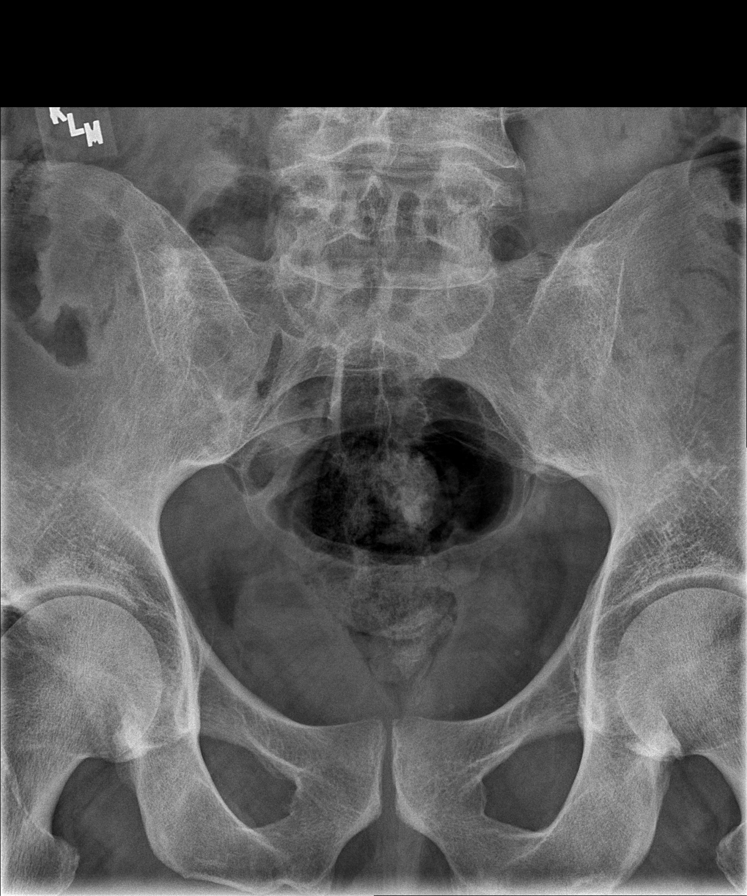

[t sacroiliac joints (2 of 4)]
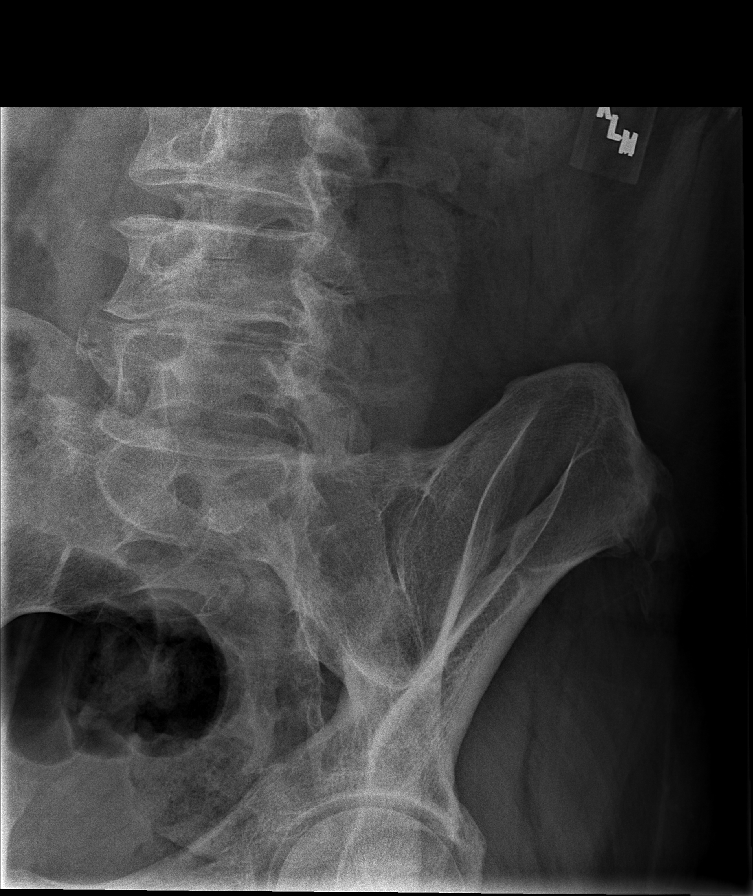

[t sacroiliac joints (3 of 4)]
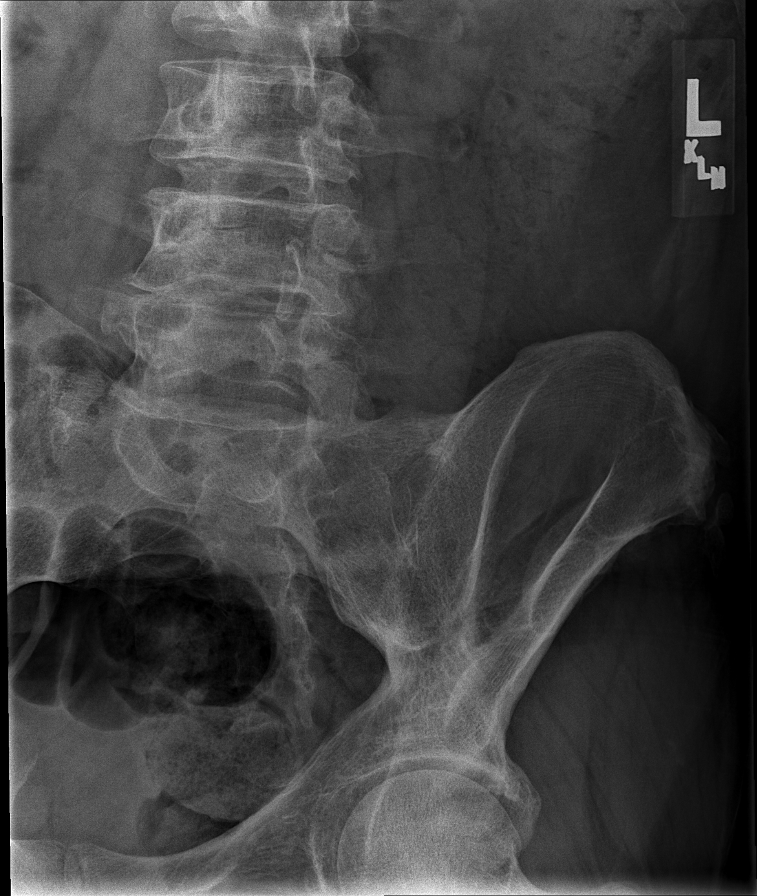

[t sacroiliac joints (4 of 4)]
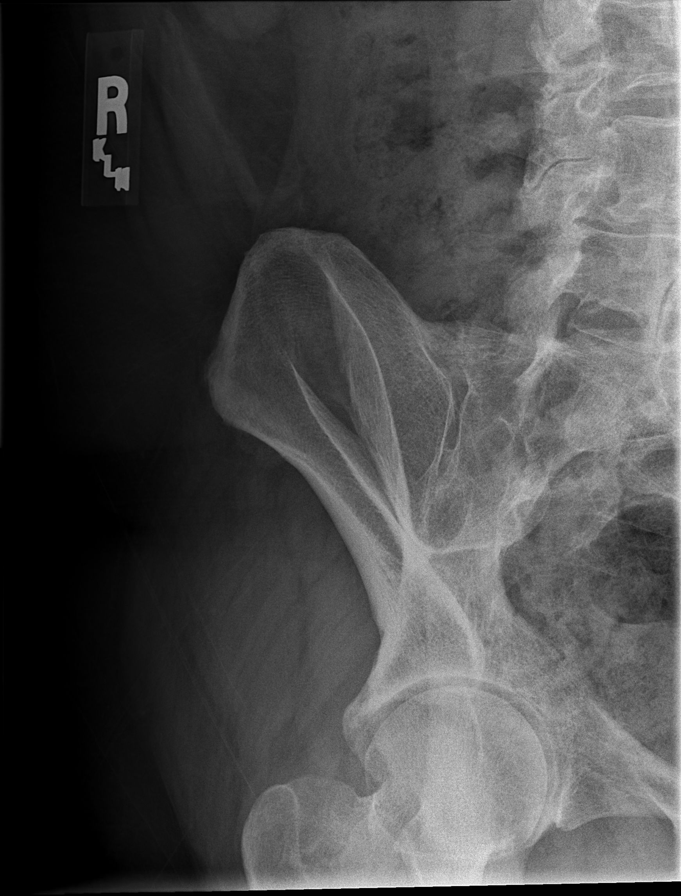

[4 of 4 positions shown; findings below may reference images not displayed]

FINDINGS: Frontal as well as bilateral oblique views were obtained. No
fracture or diastasis evident. There is partial fusion of each
sacroiliac joint consistent with a degree of sacroiliitis. No bony
destruction evident. Hip and pubic symphysis regions appear normal.
IMPRESSION: Symmetric degree of sacroiliitis bilaterally. Question a degree of
seronegative spondyloarthropathy. No fracture or diastases.

## 2019-01-18 ENCOUNTER — Other Ambulatory Visit: Payer: Self-pay

## 2019-01-18 ENCOUNTER — Encounter: Payer: Self-pay | Admitting: Family Medicine

## 2019-01-18 ENCOUNTER — Ambulatory Visit (INDEPENDENT_AMBULATORY_CARE_PROVIDER_SITE_OTHER): Payer: BLUE CROSS/BLUE SHIELD | Admitting: Family Medicine

## 2019-01-18 VITALS — BP 136/70 | HR 88 | Temp 98.4°F | Resp 16 | Ht 74.0 in | Wt 276.0 lb

## 2019-01-18 DIAGNOSIS — K219 Gastro-esophageal reflux disease without esophagitis: Secondary | ICD-10-CM

## 2019-01-18 MED ORDER — PANTOPRAZOLE SODIUM 40 MG PO TBEC: 40.0000 mg | DELAYED_RELEASE_TABLET | Freq: Two times a day (BID) | ORAL | 3 refills | Status: AC

## 2019-01-18 MED ORDER — SUCRALFATE 1 G PO TABS: 1.0000 g | ORAL_TABLET | Freq: Three times a day (TID) | ORAL | 0 refills | Status: DC

## 2019-01-18 NOTE — Progress Notes (Signed)
Subjective:    Patient ID: Kyle Wyatt, male    DOB: 1971-05-22, 48 y.o.   MRN: [id number]  HPI  Patient presents today with a one-month history of severe acid reflux.  He reports a bitter acidic hot fluid coming up into his mouth at night when he tries to sleep.  He frequently gets wet burps.  He has constant indigestion.  He reports a burning sensation in his upper throat.  Is worse whenever he lies supine at night.  He is having to sleep in a recliner to avoid acid reflux.  It is exacerbated by certain foods including fried foods, tomato sauce, acidic foods.  He denies any melena or hematochezia.  He denies any unintentional weight loss.  He does have a history of gastric bypass.  He is not drinking alcohol.  He is not drinking caffeine.  He is not eating peppermint.  He is tried Tums over-the-counter as well as Prilosec with no relief.  He denies any fevers or chills Past Medical History:  Diagnosis Date  . Apnea   . Diabetes mellitus without complication (HCC)   . High cholesterol   . Hyperlipidemia   . Hypertension   . Obesity    No past surgical history on file. No current outpatient medications on file prior to visit.   No current facility-administered medications on file prior to visit.    No Known Allergies Social History   Socioeconomic History  . Marital status: Married    Spouse name: Not on file  . Number of children: Not on file  . Years of education: Not on file  . Highest education level: Not on file  Occupational History  . Not on file  Social Needs  . Financial resource strain: Not on file  . Food insecurity    Worry: Not on file    Inability: Not on file  . Transportation needs    Medical: Not on file    Non-medical: Not on file  Tobacco Use  . Smoking status: Never Smoker  . Smokeless tobacco: Never Used  Substance and Sexual Activity  . Alcohol use: No    Alcohol/week: 0.0 standard drinks  . Drug use: No  . Sexual activity: Not on file   Lifestyle  . Physical activity    Days per week: Not on file    Minutes per session: Not on file  . Stress: Not on file  Relationships  . Social Musicianconnections    Talks on phone: Not on file    Gets together: Not on file    Attends religious service: Not on file    Active member of club or organization: Not on file    Attends meetings of clubs or organizations: Not on file    Relationship status: Not on file  . Intimate partner violence    Fear of current or ex partner: Not on file    Emotionally abused: Not on file    Physically abused: Not on file    Forced sexual activity: Not on file  Other Topics Concern  . Not on file  Social History Narrative   ** Merged History Encounter **          Review of Systems  All other systems reviewed and are negative.      Objective:   Physical Exam  Constitutional: He appears well-developed and well-nourished.  Cardiovascular: Normal rate, regular rhythm and normal heart sounds. Exam reveals no gallop and no friction rub.  No murmur  heard. Pulmonary/Chest: Effort normal and breath sounds normal. No respiratory distress. He has no wheezes. He has no rales. He exhibits no tenderness.  Abdominal: Soft. Bowel sounds are normal. He exhibits no distension and no mass. There is no abdominal tenderness. There is no rebound and no guarding.  Musculoskeletal:        General: No edema.  Vitals reviewed.         Assessment & Plan:  GERD- I am concerned that the patient may have GERD with esophagitis based on the severity of his symptoms.  He is tried changing his diet.  He is tried sleeping in an elevated position.  He is tried over-the-counter medicine such as Prilosec for more than 2 weeks with no benefit.  I will start the patient on Protonix 40 mg twice daily as well as sucralfate 1 g p.o. q. before meals at bedtime.  Reassess in 1 month.  Call in 2 weeks and give [place] an update.  If the symptoms are no better in 2 weeks I would recommend lab  work to evaluate for H. pylori and also check basic lab work including a CBC, CMP, and lipase.  If symptoms are improving I would decrease Protonix to once daily at the end of 1 month and discontinue sucralfate altogether

## 2019-02-10 ENCOUNTER — Telehealth: Payer: Self-pay | Admitting: Family Medicine

## 2019-02-10 NOTE — Telephone Encounter (Signed)
Pt calls and states that he was doing much better after starting the medication (protonic) but over the last few days his reflux has started getting bad again and he wanted to know what recommendations you have for him?   CB# [phone]

## 2019-02-11 NOTE — Telephone Encounter (Signed)
I would recommend lab work to evaluate for H. pylori breath test and also check basic lab work including a CBC, CMP, and lipase.

## 2019-02-12 ENCOUNTER — Other Ambulatory Visit: Payer: Self-pay

## 2019-02-12 ENCOUNTER — Other Ambulatory Visit: Payer: Self-pay | Admitting: Family Medicine

## 2019-02-12 ENCOUNTER — Other Ambulatory Visit: Payer: BLUE CROSS/BLUE SHIELD

## 2019-02-12 DIAGNOSIS — K219 Gastro-esophageal reflux disease without esophagitis: Secondary | ICD-10-CM

## 2019-02-12 DIAGNOSIS — R109 Unspecified abdominal pain: Secondary | ICD-10-CM

## 2019-02-12 MED ORDER — SUCRALFATE 1 G PO TABS: 1.0000 g | ORAL_TABLET | Freq: Three times a day (TID) | ORAL | 2 refills | Status: AC

## 2019-02-12 NOTE — Telephone Encounter (Signed)
Pt aware and states that he has a high dect ins plan and pays out of pocket for most stuff and would only like to have H. Pylori test done for now and if negative will come back for other labs.

## 2019-02-12 NOTE — Telephone Encounter (Signed)
I understand that is okay.  Just check H. pylori

## 2019-02-15 LAB — H. PYLORI BREATH TEST: H. pylori Breath Test: NOT DETECTED

## 2019-02-16 DIAGNOSIS — K219 Gastro-esophageal reflux disease without esophagitis: Secondary | ICD-10-CM

## 2019-02-16 DIAGNOSIS — R109 Unspecified abdominal pain: Secondary | ICD-10-CM
